# Patient Record
Sex: Female | Born: 1955
Health system: Southern US, Community
[De-identification: ages and names within clinical notes are randomized; demographics above are authoritative.]

## PROBLEM LIST (undated history)

## (undated) DIAGNOSIS — E559 Vitamin D deficiency, unspecified: Secondary | ICD-10-CM

## (undated) DIAGNOSIS — R7303 Prediabetes: Secondary | ICD-10-CM

## (undated) DIAGNOSIS — E785 Hyperlipidemia, unspecified: Secondary | ICD-10-CM

## (undated) DIAGNOSIS — F419 Anxiety disorder, unspecified: Secondary | ICD-10-CM

## (undated) DIAGNOSIS — I10 Essential (primary) hypertension: Secondary | ICD-10-CM

## (undated) DIAGNOSIS — M1710 Unilateral primary osteoarthritis, unspecified knee: Secondary | ICD-10-CM

## (undated) DIAGNOSIS — D219 Benign neoplasm of connective and other soft tissue, unspecified: Secondary | ICD-10-CM

## (undated) DIAGNOSIS — M179 Osteoarthritis of knee, unspecified: Secondary | ICD-10-CM

## (undated) HISTORY — DX: Benign neoplasm of connective and other soft tissue, unspecified: D21.9

## (undated) HISTORY — DX: Prediabetes: R73.03

## (undated) HISTORY — DX: Osteoarthritis of knee, unspecified: M17.9

## (undated) HISTORY — PX: TONSILLECTOMY: SUR1361

## (undated) HISTORY — DX: Unilateral primary osteoarthritis, unspecified knee: M17.10

## (undated) HISTORY — DX: Essential (primary) hypertension: I10

## (undated) HISTORY — PX: REDUCTION MAMMAPLASTY: SUR839

## (undated) HISTORY — PX: BREAST BIOPSY: SHX20

## (undated) HISTORY — DX: Anxiety disorder, unspecified: F41.9

## (undated) HISTORY — DX: Hyperlipidemia, unspecified: E78.5

## (undated) HISTORY — PX: CHOLECYSTECTOMY: SHX55

## (undated) HISTORY — DX: Vitamin D deficiency, unspecified: E55.9

---
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
## 1998-10-14 HISTORY — PX: BREAST REDUCTION SURGERY: SHX8

## 1998-11-17 ENCOUNTER — Other Ambulatory Visit: Admission: RE | Admit: 1998-11-17 | Discharge: 1998-11-17 | Payer: Self-pay | Admitting: Obstetrics and Gynecology

## 2000-05-21 ENCOUNTER — Other Ambulatory Visit: Admission: RE | Admit: 2000-05-21 | Discharge: 2000-05-21 | Payer: Self-pay | Admitting: Obstetrics and Gynecology

## 2001-07-17 ENCOUNTER — Other Ambulatory Visit: Admission: RE | Admit: 2001-07-17 | Discharge: 2001-07-17 | Payer: Self-pay | Admitting: *Deleted

## 2002-11-09 ENCOUNTER — Other Ambulatory Visit: Admission: RE | Admit: 2002-11-09 | Discharge: 2002-11-09 | Payer: Self-pay | Admitting: Obstetrics and Gynecology

## 2004-03-01 ENCOUNTER — Other Ambulatory Visit: Admission: RE | Admit: 2004-03-01 | Discharge: 2004-03-01 | Payer: Self-pay | Admitting: Obstetrics and Gynecology

## 2004-04-09 ENCOUNTER — Inpatient Hospital Stay (HOSPITAL_COMMUNITY): Admission: RE | Admit: 2004-04-09 | Discharge: 2004-04-11 | Payer: Self-pay | Admitting: Obstetrics and Gynecology

## 2004-04-09 ENCOUNTER — Encounter (INDEPENDENT_AMBULATORY_CARE_PROVIDER_SITE_OTHER): Payer: Self-pay | Admitting: Specialist

## 2004-04-09 HISTORY — PX: ABDOMINAL HYSTERECTOMY: SHX81

## 2005-07-31 ENCOUNTER — Encounter: Admission: RE | Admit: 2005-07-31 | Discharge: 2005-07-31 | Payer: Self-pay | Admitting: Obstetrics and Gynecology

## 2006-09-09 ENCOUNTER — Encounter: Admission: RE | Admit: 2006-09-09 | Discharge: 2006-09-09 | Payer: Self-pay | Admitting: Obstetrics and Gynecology

## 2008-08-31 ENCOUNTER — Encounter: Admission: RE | Admit: 2008-08-31 | Discharge: 2008-08-31 | Payer: Self-pay | Admitting: Obstetrics and Gynecology

## 2009-08-31 ENCOUNTER — Encounter: Admission: RE | Admit: 2009-08-31 | Discharge: 2009-08-31 | Payer: Self-pay | Admitting: Obstetrics and Gynecology

## 2010-09-05 ENCOUNTER — Encounter: Admission: RE | Admit: 2010-09-05 | Discharge: 2010-09-05 | Payer: Self-pay | Admitting: Obstetrics and Gynecology

## 2011-03-01 NOTE — Discharge Summary (Signed)
NAME:  Shirley Kent, Shirley Kent                      ACCOUNT NO.:  [id number]   MEDICAL RECORD NO.:  [id number]                   PATIENT TYPE:  INP   LOCATION:  9306                                 FACILITY:  [facility]   PHYSICIAN:  Cynthia P. Romine, M.D.             DATE OF BIRTH:  Dec 17, 1955   DATE OF ADMISSION:  04/09/2004  DATE OF DISCHARGE:  04/11/2004                                 DISCHARGE SUMMARY   DISCHARGE DIAGNOSIS:  Submucosal, intramural, and serosal leiomyomata.   PROCEDURES:  Total abdominal hysterectomy.   HISTORY:  This is a 55 year old married black female gravida 1 para 0 with  incapacitating menorrhagia unresponsive to medical management and a uterine  of 15-week size consistent with multiple fibroids.   HOSPITAL COURSE:  On May 09, 2004 the patient was taken to the operating  room and underwent a total abdominal hysterectomy.  Estimated blood loss was  200 mL.  There were no complications.  Postoperatively she had an uneventful  postoperative course and was sent home on postoperative day #2 afebrile and  in good condition.  She was given full discharge instructions regarding  pelvic rest and follow-up in the office in 6 weeks.   LABORATORY DATA:  Labs showed admission H&H 12 and 37.9, on discharge 11.8  and 35.5.  Pathology report revealed submucosal, intramural, and serosal  leiomyomata.                                               Cynthia P. Romine, M.D.    CPR/MEDQ  D:  05/02/2004  T:  05/02/2004  Job:  [id number]

## 2011-03-01 NOTE — Op Note (Signed)
NAME:  Shirley Kent, Shirley Kent                      ACCOUNT NO.:  [id number]   MEDICAL RECORD NO.:  [id number]                   PATIENT TYPE:  INP   LOCATION:  9399                                 FACILITY:  [facility]   PHYSICIAN:  Cynthia P. Romine, M.D.             DATE OF BIRTH:  01/14/1956   DATE OF PROCEDURE:  04/09/2004  DATE OF DISCHARGE:                                 OPERATIVE REPORT   PREOPERATIVE DIAGNOSES:  1. Menorrhagia.  2. Multiple intramural uterine fibroids.   POSTOPERATIVE DIAGNOSES:  1. Menorrhagia.  2. Multiple intramural uterine fibroids.  3. Pathology pending.   PROCEDURE:  Total abdominal hysterectomy.   SURGEON:  Cynthia P. Romine, M.D.   ASSISTANT:  Andres Ege, M.D.   ANESTHESIA:  General endotracheal anesthesia.   ESTIMATED BLOOD LOSS:  200 mL.   COMPLICATIONS:  None.   DESCRIPTION OF PROCEDURE:  The patient is taken to the operating room and  after the induction of adequate general endotracheal anesthesia, was prepped  and draped in the usual fashion and Foley catheter inserted.  A Pfannenstiel  incision was made and carried down to the fascia using a Bovie.  Fascia was  nicked and opened transversely.  Kochers were used to grasp the fascial  margins that were separated from the underlying rectus muscles using the  Bovie.  The rectus muscles were separated sharply in the midline.  The  underlying peritoneum was elevated between hemostats and entered  atraumatically.  The peritoneum was opened vertically.  The upper abdomen  was explored.  The liver edge was smooth.  The gallbladder contained no  stones.  There was no pelvic or periaortic adenopathy.  In the pelvis, the  uterus was enlarged and nodular, approximately 16-week size with multiple  intramural fibroids.  The left ovary was adherent to the posterior side of  the uterus.  The right ovary was free.  Both tubes appeared normal.  Long  Kellys were placed at the uterine cornu and the  uterus was elevated out of  the incision.  Round ligaments were identified on each side, stitched, cut  and the anterior leaf of the broad ligament was taken down sharply.  The  left ovary was then free from its rather dense adherence to the posterior  side of the uterus.  A window was created in the mesosalpinx and a pedicle  containing the utero-ovarian, round and tube was clamped, cut and doubly  tied on both sides.  The uterine artery on the right could be skeletonized  and was clamped, cut and doubly tied.  On the left, there was approximately  6 cm broad ligament fibroid that prevented visualization of the uterine  artery.  Therefore, this myoma was incised with the knife and shelled out at  which point, the uterine artery came into view.  It was skeletonized,  clamped, cut and doubly tied .  Upon completing of ligation of both uterine  arteries, the fundus was removed with the knife and sent for pathologic  evaluation.  The cervix was grasped with Kochers.  The hysterectomy  continued down the cardinal ligament, clamping, cutting and tying in  sequence.  The uterosacral ligaments were clamped, cut and tied on each  side.  The vagina was entered anteriorly.  The specimen was removed with  Mayo scissors.  The vaginal angles were grasped with Kochers. Angle sutures  were placed in each of the right and left angles.  The cuff was then closed  with interrupted sutures of figure-of-eight 0 chromic.  Copious warm  irrigation was then carried out and the pelvis was felt to be hemostatic.  The ureters were identified on both sides and felt to be normal.  The  ureters were tied up to the pedicle containing the round ligament to prevent  adherence to the cuff.  The pelvis was again inspected and felt to be  hemostatic.  The packs were removed.  The Balfour retractor was removed.  The peritoneum was grasped with hemostats and closed in running fashion  using 2-0 chromic.  A subfascial on cue  catheter was placed and the fascia  was closed in a running fashion going from each angle toward the midline  using 0 Vicryl.  Hemostasis was achieved of the subcutaneous tissue with the  Bovie. The subcutaneous tissue was irrigated.  An on cue catheter was placed  in the subcutaneous space.  The skin was closed with a subcuticular stitch  of 3-0 Vicryl Rapide.  The incision was supported with Benzoin and Steri-  Strips.  There were 10 mL of 1% lidocaine injected into the subfascial space  and 5 mL of 1% lidocaine were injected into the subcutaneous space and the  procedure was terminated.  Sponge, needle and instrument counts were correct  x3.  The patient was taken to the recovery room in satisfactory condition.                                               Cynthia P. Romine, M.D.    CPR/MEDQ  D:  04/09/2004  T:  04/09/2004  Job:  [id number]

## 2011-04-17 ENCOUNTER — Inpatient Hospital Stay (HOSPITAL_COMMUNITY)
Admission: EM | Admit: 2011-04-17 | Discharge: 2011-04-20 | DRG: 493 | Disposition: A | Discharge status: Home / Self Care | Payer: Federal, State, Local not specified - PPO | Attending: Internal Medicine | Admitting: Internal Medicine

## 2011-04-17 ENCOUNTER — Emergency Department (HOSPITAL_COMMUNITY): Payer: Federal, State, Local not specified - PPO

## 2011-04-17 DIAGNOSIS — E7800 Pure hypercholesterolemia, unspecified: Secondary | ICD-10-CM | POA: Diagnosis present

## 2011-04-17 DIAGNOSIS — K8060 Calculus of gallbladder and bile duct with cholecystitis, unspecified, without obstruction: Secondary | ICD-10-CM | POA: Diagnosis present

## 2011-04-17 DIAGNOSIS — Z6837 Body mass index (BMI) 37.0-37.9, adult: Secondary | ICD-10-CM

## 2011-04-17 DIAGNOSIS — K859 Acute pancreatitis without necrosis or infection, unspecified: Principal | ICD-10-CM | POA: Diagnosis present

## 2011-04-17 DIAGNOSIS — K8064 Calculus of gallbladder and bile duct with chronic cholecystitis without obstruction: Secondary | ICD-10-CM | POA: Diagnosis present

## 2011-04-17 LAB — DIFFERENTIAL
Basophils Absolute: 0 10*3/uL (ref 0.0–0.1)
Basophils Relative: 0 % (ref 0–1)
Eosinophils Absolute: 0.1 10*3/uL (ref 0.0–0.7)
Eosinophils Relative: 1 % (ref 0–5)
Lymphocytes Relative: 20 % (ref 12–46)
Lymphs Abs: 1.7 10*3/uL (ref 0.7–4.0)
Monocytes Absolute: 0.4 10*3/uL (ref 0.1–1.0)
Monocytes Relative: 5 % (ref 3–12)
Neutro Abs: 6.3 10*3/uL (ref 1.7–7.7)
Neutrophils Relative %: 74 % (ref 43–77)

## 2011-04-17 LAB — URINALYSIS, ROUTINE W REFLEX MICROSCOPIC
Bilirubin Urine: NEGATIVE
Glucose, UA: NEGATIVE mg/dL
Hgb urine dipstick: NEGATIVE
Ketones, ur: NEGATIVE mg/dL
Nitrite: NEGATIVE
Protein, ur: 30 mg/dL — AB
Specific Gravity, Urine: 1.021 (ref 1.005–1.030)
Urobilinogen, UA: 0.2 mg/dL (ref 0.0–1.0)
pH: 8.5 — ABNORMAL HIGH (ref 5.0–8.0)

## 2011-04-17 LAB — COMPREHENSIVE METABOLIC PANEL
ALT: 315 U/L — ABNORMAL HIGH (ref 0–35)
AST: 564 U/L — ABNORMAL HIGH (ref 0–37)
Albumin: 3.9 g/dL (ref 3.5–5.2)
Alkaline Phosphatase: 106 U/L (ref 39–117)
BUN: 11 mg/dL (ref 6–23)
CO2: 27 mEq/L (ref 19–32)
Calcium: 9.7 mg/dL (ref 8.4–10.5)
Chloride: 100 mEq/L (ref 96–112)
Creatinine, Ser: 0.71 mg/dL (ref 0.50–1.10)
GFR calc Af Amer: >60 mL/min (ref >60)
GFR calc non Af Amer: >60 mL/min (ref >60)
Glucose, Bld: 136 mg/dL — ABNORMAL HIGH (ref 70–99)
Potassium: 3.7 mEq/L (ref 3.5–5.1)
Sodium: 140 mEq/L (ref 135–145)
Total Bilirubin: 1.1 mg/dL (ref 0.3–1.2)
Total Protein: 7.3 g/dL (ref 6.0–8.3)

## 2011-04-17 LAB — URINE MICROSCOPIC-ADD ON

## 2011-04-17 LAB — CBC
HCT: 37.1 % (ref 36.0–46.0)
Hemoglobin: 12.6 g/dL (ref 12.0–15.0)
MCH: 29.9 pg (ref 26.0–34.0)
MCHC: 34 g/dL (ref 30.0–36.0)
MCV: 87.9 fL (ref 78.0–100.0)
Platelets: 254 10*3/uL (ref 150–400)
RBC: 4.22 MIL/uL (ref 3.87–5.11)
RDW: 13.1 % (ref 11.5–15.5)
WBC: 8.5 10*3/uL (ref 4.0–10.5)

## 2011-04-17 LAB — LIPASE, BLOOD: Lipase: >3000 U/L — ABNORMAL HIGH (ref 11–59)

## 2011-04-18 ENCOUNTER — Other Ambulatory Visit (HOSPITAL_COMMUNITY): Payer: BC Managed Care – PPO

## 2011-04-18 DIAGNOSIS — K859 Acute pancreatitis without necrosis or infection, unspecified: Secondary | ICD-10-CM

## 2011-04-18 DIAGNOSIS — R1011 Right upper quadrant pain: Secondary | ICD-10-CM

## 2011-04-18 DIAGNOSIS — K8020 Calculus of gallbladder without cholecystitis without obstruction: Secondary | ICD-10-CM

## 2011-04-18 LAB — CBC
HCT: 32.8 % — ABNORMAL LOW (ref 36.0–46.0)
Hemoglobin: 10.7 g/dL — ABNORMAL LOW (ref 12.0–15.0)
MCH: 28.7 pg (ref 26.0–34.0)
MCHC: 32.6 g/dL (ref 30.0–36.0)
MCV: 87.9 fL (ref 78.0–100.0)
Platelets: 226 10*3/uL (ref 150–400)
RBC: 3.73 MIL/uL — ABNORMAL LOW (ref 3.87–5.11)
RDW: 13.3 % (ref 11.5–15.5)
WBC: 6.2 10*3/uL (ref 4.0–10.5)

## 2011-04-18 LAB — COMPREHENSIVE METABOLIC PANEL
ALT: 200 U/L — ABNORMAL HIGH (ref 0–35)
AST: 147 U/L — ABNORMAL HIGH (ref 0–37)
Albumin: 3.3 g/dL — ABNORMAL LOW (ref 3.5–5.2)
Alkaline Phosphatase: 90 U/L (ref 39–117)
BUN: 10 mg/dL (ref 6–23)
CO2: 27 mEq/L (ref 19–32)
Calcium: 8.8 mg/dL (ref 8.4–10.5)
Chloride: 107 mEq/L (ref 96–112)
Creatinine, Ser: 0.67 mg/dL (ref 0.50–1.10)
GFR calc Af Amer: >60 mL/min (ref >60)
GFR calc non Af Amer: >60 mL/min (ref >60)
Glucose, Bld: 99 mg/dL (ref 70–99)
Potassium: 3.3 mEq/L — ABNORMAL LOW (ref 3.5–5.1)
Sodium: 141 mEq/L (ref 135–145)
Total Bilirubin: 0.5 mg/dL (ref 0.3–1.2)
Total Protein: 6.3 g/dL (ref 6.0–8.3)

## 2011-04-18 LAB — LIPID PANEL
Cholesterol: 183 mg/dL (ref 0–200)
HDL: 49 mg/dL (ref >39)
LDL Cholesterol: 89 mg/dL (ref 0–99)
Total CHOL/HDL Ratio: 3.7 RATIO
Triglycerides: 227 mg/dL — ABNORMAL HIGH (ref <150)
VLDL: 45 mg/dL — ABNORMAL HIGH (ref 0–40)

## 2011-04-18 LAB — DIFFERENTIAL
Basophils Absolute: 0 10*3/uL (ref 0.0–0.1)
Basophils Relative: 0 % (ref 0–1)
Eosinophils Absolute: 0.2 10*3/uL (ref 0.0–0.7)
Eosinophils Relative: 4 % (ref 0–5)
Lymphocytes Relative: 47 % — ABNORMAL HIGH (ref 12–46)
Lymphs Abs: 2.9 10*3/uL (ref 0.7–4.0)
Monocytes Absolute: 0.4 10*3/uL (ref 0.1–1.0)
Monocytes Relative: 7 % (ref 3–12)
Neutro Abs: 2.7 10*3/uL (ref 1.7–7.7)
Neutrophils Relative %: 42 % — ABNORMAL LOW (ref 43–77)

## 2011-04-18 LAB — LIPASE, BLOOD: Lipase: 781 U/L — ABNORMAL HIGH (ref 11–59)

## 2011-04-18 LAB — AMYLASE: Amylase: 575 U/L — ABNORMAL HIGH (ref 0–105)

## 2011-04-18 LAB — LACTATE DEHYDROGENASE
LDH: 387 U/L — ABNORMAL HIGH (ref 94–250)
LDH: 205 U/L (ref 94–250)

## 2011-04-18 MED ORDER — IOHEXOL 300 MG/ML  SOLN
100.0000 mL | Freq: Once | INTRAMUSCULAR | Status: AC | PRN
  Administered 2011-04-18: 100 mL via INTRAVENOUS

## 2011-04-19 ENCOUNTER — Other Ambulatory Visit (INDEPENDENT_AMBULATORY_CARE_PROVIDER_SITE_OTHER): Payer: Self-pay | Admitting: Surgery

## 2011-04-19 ENCOUNTER — Inpatient Hospital Stay (HOSPITAL_COMMUNITY): Payer: Federal, State, Local not specified - PPO

## 2011-04-19 ENCOUNTER — Other Ambulatory Visit (HOSPITAL_COMMUNITY): Payer: BC Managed Care – PPO

## 2011-04-19 LAB — COMPREHENSIVE METABOLIC PANEL
ALT: 152 U/L — ABNORMAL HIGH (ref 0–35)
AST: 73 U/L — ABNORMAL HIGH (ref 0–37)
Albumin: 3.1 g/dL — ABNORMAL LOW (ref 3.5–5.2)
Alkaline Phosphatase: 84 U/L (ref 39–117)
BUN: 7 mg/dL (ref 6–23)
CO2: 28 mEq/L (ref 19–32)
Calcium: 8.7 mg/dL (ref 8.4–10.5)
Chloride: 107 mEq/L (ref 96–112)
Creatinine, Ser: 0.67 mg/dL (ref 0.50–1.10)
GFR calc Af Amer: >60 mL/min (ref >60)
GFR calc non Af Amer: >60 mL/min (ref >60)
Glucose, Bld: 99 mg/dL (ref 70–99)
Potassium: 3.5 mEq/L (ref 3.5–5.1)
Sodium: 142 mEq/L (ref 135–145)
Total Bilirubin: 0.4 mg/dL (ref 0.3–1.2)
Total Protein: 6.3 g/dL (ref 6.0–8.3)

## 2011-04-19 LAB — CBC
HCT: 33.2 % — ABNORMAL LOW (ref 36.0–46.0)
Hemoglobin: 10.6 g/dL — ABNORMAL LOW (ref 12.0–15.0)
MCH: 28.6 pg (ref 26.0–34.0)
MCHC: 31.9 g/dL (ref 30.0–36.0)
MCV: 89.7 fL (ref 78.0–100.0)
Platelets: 222 10*3/uL (ref 150–400)
RBC: 3.7 MIL/uL — ABNORMAL LOW (ref 3.87–5.11)
RDW: 13.4 % (ref 11.5–15.5)
WBC: 6.7 10*3/uL (ref 4.0–10.5)

## 2011-04-19 LAB — ANA: Anti Nuclear Antibody(ANA): NEGATIVE

## 2011-04-20 NOTE — Op Note (Signed)
Shirley Kent, PALS NO.:  [id number]  MEDICAL RECORD NO.:  [id number]  LOCATION:  5149                         FACILITY:  [facility]  PHYSICIAN:  Abigail Miyamoto, M.D. DATE OF BIRTH:  1956/03/13  DATE OF PROCEDURE:  04/19/2011 DATE OF DISCHARGE:                              OPERATIVE REPORT   PREOPERATIVE DIAGNOSIS:  Gallstone pancreatitis.  POSTOPERATIVE DIAGNOSIS:  Gallstone pancreatitis.  PROCEDURE:  Laparoscopic cholecystectomy with intraoperative cholangiogram.  SURGEON:  Abigail Miyamoto, MD  ANESTHESIA:  General endotracheal anesthesia.  ESTIMATED BLOOD LOSS:  Minimal.  INDICATIONS:  This is a 55 year old female who presents with epigastric right upper quadrant abdominal pain.  She was found to have pancreatitis.  She had a CAT scan of the abdomen and pelvis confirming this as well as gallstones.  Decision was made to proceed to the operating room for a laparoscopic cholecystectomy as the patient improved quickly.  FINDINGS:  The patient was found to have a chronically scarred appearing gallbladder consistent with chronic cholecystitis.  Cholangiogram was normal without evidence of obstructing stone.  PROCEDURE IN DETAIL:  The patient was brought to the operating room and identified as Shirley Kent.  She was placed supine on the operating table and general anesthesia was induced.  Her abdomen was then prepped and draped in the usual sterile fashion.  Using a #15 blade, a vertical incision was made below the umbilicus.  This was carried down to fascia which was then opened with a scalpel.  Hemostat was used to pass to the peritoneal cavity under direct vision.  Next, a 0 Vicryl suture was placed around the fascial opening.  The Hasson port was placed through the opening and insufflation of the abdomen was begun.  A 5-mm port was then placed in the patient's epigastrium and two more placed in the patient's right upper quadrant under direct  vision.  The gallbladder was found to be thick-walled with adhesions to this.  I was able to grasp the gallbladder and easily retract it above the liver bed.  The adhesions have been taken down bluntly.  The cystic duct and cystic artery were then dissected out and a critical window was achieved around both.  I clipped the artery three times proximally, once distally, I then clipped the duct once distally and then opened it up with laparoscopic scissors.  A small incision was then made in the right upper quadrant and cholangiocatheter was passed through this and into the opening in the cystic duct.  The cholangiogram was then performed with contrast.  Contrast seemed to flow into the biliary system and duodenum without evidence of obstruction.  At this point, the cholangiocatheter was removed.  I clipped the cystic duct three times proximally and then completely transected along with the artery.  The gallbladder was then slowly dissected free from liver bed with electrocautery.  Once this was freed from the liver bed, it was removed through the incision at the umbilicus.  I again evaluated the liver bed, again hemostasis appeared to be achieved.  I then irrigated the abdomen with saline.  All ports were removed under direct vision and the abdomen was deflated.  All incisions were then anesthetized with Marcaine.  The  0 Vicryl at the umbilicus was tied in place closing the fascial defect.  All incisions were then closed with 4-0 Monocryl subcuticular sutures.  Steri-Strips and Band-Aids were then applied.  The patient tolerated the procedure well.  All counts were correct at the end of the procedure.  The patient was then extubated in the operating room and taken in stable condition to recovery room.     Abigail Miyamoto, M.D.     DB/MEDQ  D:  04/19/2011  T:  04/19/2011  Job:  [id number]  Electronically Signed by Abigail Miyamoto M.D. on 04/20/2011 10:45:25 AM

## 2011-04-21 HISTORY — PX: CHOLECYSTECTOMY, LAPAROSCOPIC: SHX56

## 2011-04-25 NOTE — Consult Note (Signed)
Shirley, Kent NO.:  [id number]  MEDICAL RECORD NO.:  [id number]  LOCATION:  5149                         FACILITY:  [facility]  PHYSICIAN:  Abigail Miyamoto, M.D. DATE OF BIRTH:  23-Apr-1956  DATE OF CONSULTATION:  04/18/2011 DATE OF DISCHARGE:                                CONSULTATION   REFERRING PHYSICIAN:  Jeoffrey Massed, MD  PRIMARY CARE PHYSICIAN:  Merlene Laughter. Renae Gloss, MD  CHIEF COMPLAINT:  Abdominal pain.  BRIEF HISTORY:  The patient is a 55 year old African American female, who is in good health.  She had a late breakfast yesterday and developed acute onset of abdominal pain around 2:00 p.m.  The pain never got any better.  She ultimately went to the emergency room at 9:00 p.m.  She said she had a similar problem about 2 months ago and at that time the pain lasted about an hour and it was as bad.  She denies any nausea, vomiting, diarrhea, constipation, or blood with her pain.  Pain was better in the ER with treatment.  Currently, she is feeling better on clear liquids.  No current abdominal pain.  Workup in the ER included a CBC which shows a white count of 7.5, a hemoglobin 12.6, hematocrit 37, platelets 254,000.  UA was normal.  Sodium is 137, potassium is 3.7, chloride is 100, CO2 is 27, BUN is 11, creatinine is 0.71, glucose 136. Total bilirubin is 1.1.  LFTs showed an alk phos of 106, SGOT of 564, SGPT of 315.  Today, April 18, 2011, total bilirubin 0.5, alk phos 90, SGOT 147, SGPT 200.  Lipase on admission was greater than 3000, today it was less 781.  LDH is 205, amylase is 575.  Total cholesterol of 183, triglycerides 227, HDL 49, LDL is 89.  CT of the abdomen shows stranding around the pancreas.  Liver, spleen, and kidneys were normal.  There is layering gallstone to the gallbladder.  Common bile duct appeared normal.  There was no intrahepatic or ductal dilatation.  She has a prior hysterectomy with no adnexal masses.  No free-fluid air  or adenopathy.  PAST MEDICAL HISTORY: 1. Dyslipidemia. 2. Height is 64 inches, weight is 221 pounds.  She has been on diet     and is down from 237 in January 2012. 3. Hysterectomy in 2005. 4. History of breast reduction. 5. History of tonsil adenoids in 1980.  FAMILY HISTORY:  Mother is living at 50, had some cholesterol issues and coronary artery disease.  Father is deceased, unknown.  She has 3 brothers, 2 are deceased, one with alcohol problems, one traumatic.  One brother is living in good health.  One sister deceased with alcohol use and one is living in good health.  SOCIAL HISTORY:  The patient is married, works in Airline pilot.  Her husband is here with her.  Alcohol none.  Drugs none.  Tobacco none.  REVIEW OF SYSTEMS:  Fever, none.  Weight loss as noted in January with diet and exercise.  There is psych issues.  No skin changes.  CV: Negative.  PULMONARY:  Positive for snore.  No apnea.  No orthopnea, PND, or dyspnea on exertion.  She had recent cold.  GI:  Negative for GERD, nausea, vomiting, diarrhea, constipation, or blood.  GU:  No trouble voiding.  CARDIAC:  No chest pain or palpitations.  LOWER EXTREMITIES:  No edema.  No claudication.  MUSCULOSKELETAL:  She has some arthritis in her knees and her right foot.  ENDOCRINE:  No diabetes or thyroid issues.  CURRENT MEDICATIONS:  She was on Crestor, apparently is on WelChol now. No other meds.  ALLERGIES:  None known.  PHYSICAL EXAM:  GENERAL:  This is a well-nourished, well-developed white female in no acute distress. VITAL SIGNS:  Temperature is 98.4, heart rate is 71, blood pressure is 125/66, respiratory rate 18 to 21, sat 98% on room air. HEENT:  Head, normocephalic.  Eyes, ears, nose, and throat are within normal limits. NECK:  Trachea is in midline.  No bruits.  No JVD. CHEST:  Clear to auscultation and percussion. CARDIAC:  Normal S1 and S2.  No murmurs, rubs, or gallops.  Pulses are +2 and equal in both upper  and lower extremities. ABDOMEN:  Soft and nontender.  Positive bowel sounds.  No palpable hepatosplenomegaly.  There is no abdominal masses, hernias, or abscesses. GU/RECTAL:  Deferred. MUSCULOSKELETAL:  No joint changes noted. SKIN:  No changes. NEUROLOGIC:  No focal changes.  Cranial nerves II through XII grossly intact. PSYCH:  Normal affect.  IMPRESSION: 1. Gallstone pancreatitis. 2. Dyslipidemia. 3. BMI of 37.9.  PLAN:  We will keep her n.p.o. after midnight and recheck her labs in the morning.  If they are normal, we will aim for cholecystectomy tomorrow.  I will hold her Lovenox the a.m. until we decide on doing her gallbladder.  Further workup and evaluation as needed.  She has been seen by Dr. Magnus Ivan and the risks and benefits were discussed with him also.     Eber Hong, P.A.   ______________________________ Abigail Miyamoto, M.D.    WDJ/MEDQ  D:  04/18/2011  T:  04/18/2011  Job:  [id number]  cc:   Merlene Laughter. Renae Gloss, M.D.  Electronically Signed by Sherrie George P.A. on 04/22/2011 01:12:20 PM Electronically Signed by Abigail Miyamoto M.D. on 04/25/2011 07:24:20 PM

## 2011-04-25 NOTE — H&P (Signed)
Shirley Kent, BENCOSME NO.:  [id number]  MEDICAL RECORD NO.:  [id number]  LOCATION:  [facility]                         FACILITY:  [facility]  PHYSICIAN:  Jonny Ruiz, MD    DATE OF BIRTH:  03-27-1956  DATE OF ADMISSION:  04/17/2011 DATE OF DISCHARGE:                             HISTORY & PHYSICAL   CHIEF COMPLAINT:  Abdominal pain.  HISTORY OF PRESENT ILLNESS:  The patient is a healthy 55 year old female with past medical history significant for hypercholesteremia, who presents to the emergency department because sudden onset of epigastric abdominal pain radiating to the right upper quadrant, rated 10/10 in severity, lasting from 3 p.m. until she came to the emergency department around 8 p.m., and relieved by morphine sulfate given in the ED.  The patient vomited twice yellow liquid contents with no hematemesis.  The patient was feeling well prior to this episode of abdominal pain and vomiting.  The patient denies prior history of abdominal pain, nausea, or vomiting.  Initial evaluation in the emergency department revealed a lipase greater than 3000, and a CT scan of the abdomen revealed changes of mild pancreatitis with gallstones.  The patient is being admitted for further management.  PAST MEDICAL HISTORY:  Significant for hypercholesteremia, for which the patient takes Crestor and recently began taking WelCho1.  ALLERGIES:  No known drug allergies.  FAMILY HISTORY:  Hypercholesteremia.  SURGICAL HISTORY:  Hysterectomy for large fibroids and uncontrolled bleeding.  SOCIAL HISTORY:  The patient works as a Forensic psychologist.  She is married.  Husband at the bedside.  Denies tobacco, alcohol, or illicits.  REVIEW OF SYSTEMS:  CONSTITUTIONAL:  Negative for fever, chills, night sweats, fatigue, malaise, or weight loss.  CARDIOVASCULAR:  Negative for chest pain, shortness of breath, palpitations, orthopnea, nocturia, PND or edema.   RESPIRATORY:  Negative for cough, shortness of breath, wheezing, hemoptysis.  GASTROINTESTINAL:  Denies hematochezia, melena, diarrhea, or constipation.  GENITOURINARY:  Denies dysuria, frequency or hematuria.  MUSCULOSKELETAL:  No arthralgias or myalgias, joint pain or morning stiffness.  DERMATOLOGIC:  Denies any skin rash or growth.  PHYSICAL EXAMINATION:  VITAL SIGNS:  Blood pressure 169/84, pulse 81, respirations 18, temperature 98.4, sats 98% on room air. GENERAL APPEARANCE:  The patient is an overweight African American female, who appears in no distress.  Very pleasant and cooperative. SKIN MUCOSA:  No jaundice.  Good turgor. HEENT:  Unremarkable. NECK:  Supple.  No lymphadenopathy.  No JVD. HEART:  Regular S1 and S2 without gallops, murmurs, or rubs. LUNGS:  Clear to auscultation. ABDOMEN:  Obese with normal bowel sounds, soft.  There is mild tenderness on palpation of the right lower quadrant which increased with deep inspiration.  I am unable to palpate the gallbladder.  No organomegaly or masses palpable. EXTREMITIES:  Without clubbing, cyanosis or edema. NEUROLOGIC:  Nonfocal.  LABORATORY ASSESSMENT:  Sodium 140, potassium 3.7, chloride 100, carbon dioxide 27, BUN 11, creatinine 0.71, calcium 9.7, total protein 7.3, albumin 3.9, AST 564, ALT 315, alk phos 106, bilirubin 1.1, lipase greater than 3000.  WBC 8.5, hemoglobin 12.6, hematocrit 37.1, MCV 87.9, platelet count 254.  UA negative.  CT scan of the abdomen, as in  HPI.  MEDICAL DECISION MAKING:  A 55 year old female with no significant past medical history presents into the ED with sudden onset of abdominal pain, nausea and vomiting, and found with acute gallstone pancreatitis. The patient is medically stable and at low-risk at this point.  She will be admitted to a regular medical floor.  Will be n.p.o. and will be maintained on IV fluids.  We will repeat her labs including CMET, amylase, lipase, along with an ANA  and a lipid profile and LDH at around noon today.  The patient is noted to be hypertensive, we will reassess once she is up on the floor.  The patient eventually will need to gallbladder removed, but at this point, she will be managed conservatively.          ______________________________ Jonny Ruiz, MD     GL/MEDQ  D:  04/18/2011  T:  04/18/2011  Job:  [id number]  Electronically Signed by Jonny Ruiz MD on 04/25/2011 04:53:39 PM

## 2011-04-28 NOTE — Discharge Summary (Signed)
Shirley Kent, KUSHNER NO.:  [id number]  MEDICAL RECORD NO.:  [id number]  LOCATION:  5149                         FACILITY:  [facility]  PHYSICIAN:  Jeoffrey Massed, MD    DATE OF BIRTH:  Sep 18, 1956  DATE OF ADMISSION:  04/17/2011 DATE OF DISCHARGE:                        DISCHARGE SUMMARY - REFERRING   PRIMARY CARE PRACTITIONER:  Merlene Laughter. Renae Gloss, MD  PRIMARY DISCHARGE DIAGNOSIS:  Gallstone pancreatitis status post cholecystectomy.  SECONDARY DISCHARGE DIAGNOSIS/PAST MEDICAL HISTORY:  Dyslipidemia.  DISCHARGE MEDICATIONS: 1. Vicodin 5/325 one to two tablets every 4 hours p.r.n. 2. Aleve over the counter 220 mg two tablets p.o. q.12 h. p.r.n. 3. Fish oil 1 tablet daily. 4. Multivitamins 1 tablet daily. 5. Red krill oil over the counter 1 tablet daily. 6. TripleFlex 1 tablet p.o. daily. 7. WelChol 1.875 g per packet, 1 packet p.o. after dinner.  CONSULTATIONS:  Dr. Magnus Ivan from [facility]. HISTORY OF PRESENT ILLNESS:  The patient is a 55 year old female who presented to the ED on April 17, 2011, with abdominal pain.  She was found to have gallstone pancreatitis and admitted for the supportive care. For further details, please see the history and physical that was dictated by Dr. Jordan Hawks on admission.  PERTINENT RADIOLOGICAL STUDIES:  CT of the abdomen and pelvis with contrast showed slight stranding around the pancreas and transverse duodenum, likely reflecting mild pancreatitis.  There was no common bile duct dilatation or intrahepatic ductal dilatation.  PROCEDURES PERFORMED:  The patient underwent a laparoscopic cholecystectomy with intraoperative angiogram which was normal.  PERTINENT LABORATORY DATA: 1. Lipase on admission was more than 3000. 2. Lipase on discharge is 97.  BRIEF HOSPITAL COURSE: 1. Gallstone pancreatitis.  The patient came with abdominal pain with     LFT elevation with CT of the abdomen evidence of gallstones.   She     was given supportive care, briefly kept n.p.o. and improved     rapidly.  Subsequently, Surgery was consulted for a cholecystectomy     which was then done on April 19, 2011.  Please note, an     intraoperative cholangiogram was negative for choledocholithiasis.     She has done well postoperatively and has tolerated a regular diet     and has moved her bowels and has been cleared for surgery for     discharge today.  She is to follow up with surgery on May 07, 2011, at 3:10 p.m. 2. Dyslipidemia.  She is to continue WelChol and is to stop Crestor     until her LFTs are normalized.  Before she resumes Crestor, she is     to follow up with her primary care practitioner as well.  All of     this was explained to the patient in detail by me.  DISPOSITION:  The patient is stable to be discharged home.  FOLLOWUP INSTRUCTIONS: 1. The patient is to follow up with [facility] on Mar 07, 2011, at 3:10 p.m. 2. The patient is to follow up with Dr. Andi Devon within 1     week.  She is to call and make an appointment.  Currently, her  LFTs     are still mildly elevated and we are holding her Crestor and plan     to resume it once her LFTs are normalized.  Total time spent for discharge 35 minutes.     Jeoffrey Massed, MD     SG/MEDQ  D:  04/20/2011  T:  04/20/2011  Job:  [id number]  cc:   Merlene Laughter. Renae Gloss, M.D. Dr. Magnus Ivan from [facility]  Electronically Signed by Jeoffrey Massed  on 04/28/2011 11:15:54 AM

## 2011-05-07 ENCOUNTER — Encounter (INDEPENDENT_AMBULATORY_CARE_PROVIDER_SITE_OTHER): Payer: Self-pay

## 2011-05-07 ENCOUNTER — Ambulatory Visit (INDEPENDENT_AMBULATORY_CARE_PROVIDER_SITE_OTHER): Payer: Federal, State, Local not specified - PPO | Admitting: Radiology

## 2011-05-07 DIAGNOSIS — K8020 Calculus of gallbladder without cholecystitis without obstruction: Secondary | ICD-10-CM

## 2011-05-07 DIAGNOSIS — K851 Biliary acute pancreatitis without necrosis or infection: Secondary | ICD-10-CM

## 2011-05-07 DIAGNOSIS — K859 Acute pancreatitis without necrosis or infection, unspecified: Secondary | ICD-10-CM

## 2011-05-07 NOTE — Progress Notes (Addendum)
Shirley Kent is a 55 y.o. female who had a laparoscopic cholecystectomy with intraoperative cholangiogram on 04/19/11.  The pathology report confirmed cholelithiasis.  The patient reports that they are feeling well with normal bowel movements and good appetite.  The pre-operative symptoms of abdominal pain, nausea, and vomiting have resolved.  She is ready to return to work.  Physical examination - Incisions appear well-healed with no sign of infection or bleeding.   Abdomen - soft, non-tender  Impression:  s/p laparoscopic cholecystectomy for biliary pancreatitis  Plan:  Resume regular diet and full activity.  Follow-up on a PRN basis. RTW as of 05/09/11. Originally paperwork filed for return to work as of 05/15/11, but pt states she wishes to return os 05/09/11. KB

## 2011-09-17 ENCOUNTER — Other Ambulatory Visit: Payer: Self-pay | Admitting: Internal Medicine

## 2011-09-17 DIAGNOSIS — Z1231 Encounter for screening mammogram for malignant neoplasm of breast: Secondary | ICD-10-CM

## 2011-09-25 ENCOUNTER — Ambulatory Visit
Admission: RE | Admit: 2011-09-25 | Discharge: 2011-09-25 | Disposition: A | Discharge status: Home / Self Care | Payer: Federal, State, Local not specified - PPO | Source: Ambulatory Visit | Attending: Internal Medicine | Admitting: Internal Medicine

## 2011-09-25 DIAGNOSIS — Z1231 Encounter for screening mammogram for malignant neoplasm of breast: Secondary | ICD-10-CM

## 2012-03-27 LAB — HM COLONOSCOPY: HM COLON: NORMAL

## 2013-08-26 ENCOUNTER — Encounter: Payer: Self-pay | Admitting: Nurse Practitioner

## 2013-08-26 ENCOUNTER — Ambulatory Visit (INDEPENDENT_AMBULATORY_CARE_PROVIDER_SITE_OTHER): Payer: Federal, State, Local not specified - PPO | Admitting: Nurse Practitioner

## 2013-08-26 VITALS — BP 130/90 | HR 76 | Resp 20 | Ht 64.5 in | Wt 230.0 lb

## 2013-08-26 DIAGNOSIS — Z01419 Encounter for gynecological examination (general) (routine) without abnormal findings: Secondary | ICD-10-CM

## 2013-08-26 MED ORDER — VITAMIN D (ERGOCALCIFEROL) 1.25 MG (50000 UNIT) PO CAPS: 50000.0000 [IU] | ORAL_CAPSULE | ORAL | Status: DC

## 2013-08-26 NOTE — Progress Notes (Signed)
Patient ID: Shirley Kent, female   DOB: 29-Oct-1955, 57 y.o.   MRN: [id number] 57 y.o. G1P0010 Married African American Fe here for annual exam.  No new health problems except working on BP.  Husband is now retied and he is very helpful at home while she is working.  Some vaginal dryness but does OK with lubrication.  Only ocasioanal use of Estrace vaginal cream.  No LMP recorded. Patient has had a hysterectomy.          Sexually active: yes  The current method of family planning is status post hysterectomy.    Exercising: no  Exercise is limited by orthopedic condition(s): arthritis in knees and right foot. Smoker:  no  Health Maintenance: Pap: 03/01/04, WNL MMG: 09/30/11, Bi-Rads 2: negative Colonoscopy: 03/27/12, normal, repeat 5 years BMD: never TDaP: 6/11 Labs: HB: PCP  Urine: PCP    reports that she has never smoked. She has never used smokeless tobacco. She reports that she does not drink alcohol or use illicit drugs.  Past Medical History  Diagnosis Date  . Fibroid   . OA (osteoarthritis) of knee     bilateral  . Hyperlipidemia   . Vitamin D deficiency disease   . Hypertension     not on meds, working on diet and exercise    Past Surgical History  Procedure Laterality Date  . Breast reduction surgery Bilateral 2000  . Breast surgery Left 1972    biopsy benign  . Cholecystectomy, laparoscopic  04/21/11  . Abdominal hysterectomy  04/09/04    fibroids, ovaries remain  . Tonsillectomy  age 29    Current Outpatient Prescriptions  Medication Sig Dispense Refill  . Omega-3 Fatty Acids (FISH OIL) 1000 MG CAPS Take 1 capsule by mouth daily.      . simvastatin (ZOCOR) 40 MG tablet Take 1 tablet by mouth every other day.      . Vitamin D, Ergocalciferol, (DRISDOL) 50000 UNITS CAPS capsule Take 1 capsule (50,000 Units total) by mouth once a week.  30 capsule  3   No current facility-administered medications for this visit.    Family History  Problem Relation Age of Onset   . Heart failure Mother   . Kidney disease Mother     ROS:  Pertinent items are noted in HPI.  Otherwise, a comprehensive ROS was negative.  Exam:   BP 130/90  Pulse 76  Resp 20  Ht 5' 4.5" (1.638 m)  Wt 230 lb (104.327 kg)  BMI 38.88 kg/m2 Height: 5' 4.5" (163.8 cm)  Ht Readings from Last 3 Encounters:  08/26/13 5' 4.5" (1.638 m)    General appearance: alert, cooperative and appears stated age Head: Normocephalic, without obvious abnormality, atraumatic Neck: no adenopathy, supple, symmetrical, trachea midline and thyroid normal to inspection and palpation Lungs: clear to auscultation bilaterally Breasts: normal appearance, no masses or tenderness, post reduction scars. Heart: regular rate and rhythm Abdomen: soft, non-tender; no masses,  no organomegaly Extremities: extremities normal, atraumatic, no cyanosis or edema Skin: Skin color, texture, turgor normal. No rashes or lesions Lymph nodes: Cervical, supraclavicular, and axillary nodes normal. No abnormal inguinal nodes palpated Neurologic: Grossly normal   Pelvic: External genitalia:  no lesions              Urethra:  normal appearing urethra with no masses, tenderness or lesions              Bartholin's and Skene's: normal  Vagina: normal appearing vagina with normal color and discharge, no lesions              Cervix: absent              Pap taken: no Bimanual Exam:  Uterus:  uterus absent              Adnexa: no mass, fullness, tenderness               Rectovaginal: Confirms               Anus:  normal sphincter tone, no lesions  A:  Well Woman with normal exam  S/P TAH secondary to UTE fibroids 04/09/04, ovaries remain, no ERT  History of Menorrhagia  History of elevated BP  History of OA bilateral knees and feet  Vit D defieciency - labs done 06/2013 at PCP  P:   Pap smear as per guidelines   Refill Vit D for a year - will get copy of labs from PCP  Mammogram is due now and will  schedule  Does not need a refill on Estrace Vagina cream at this time.  Given potential side effects and warnings of estrogen.  Counseled on breast self exam, adequate intake of calcium and vitamin D, diet and exercise return annually or prn  An After Visit Summary was printed and given to the patient.

## 2013-08-26 NOTE — Patient Instructions (Signed)

## 2013-08-27 ENCOUNTER — Other Ambulatory Visit: Payer: Self-pay

## 2013-08-27 DIAGNOSIS — Z1231 Encounter for screening mammogram for malignant neoplasm of breast: Secondary | ICD-10-CM

## 2013-08-29 NOTE — Progress Notes (Signed)
Encounter reviewed by Dr. Brook Silva.  

## 2013-09-13 ENCOUNTER — Other Ambulatory Visit: Payer: Self-pay | Admitting: Nurse Practitioner

## 2013-09-13 MED ORDER — HYDROCORTISONE ACETATE 25 MG RE SUPP: 25.0000 mg | Freq: Two times a day (BID) | RECTAL | Status: DC

## 2013-09-13 NOTE — Telephone Encounter (Signed)
Pt calling for refill on the Hydrocortisone AC to [facility] at [phone].

## 2013-09-13 NOTE — Telephone Encounter (Signed)
LM on patient's VM that rx has been sent. 

## 2013-09-13 NOTE — Telephone Encounter (Signed)
AEX was 08/26/13 no rx was sent Last Given: 08/22/11 #24/3 refills  Okay to refill?  Please advise.

## 2013-09-29 ENCOUNTER — Ambulatory Visit: Payer: Federal, State, Local not specified - PPO

## 2013-10-11 ENCOUNTER — Ambulatory Visit
Admission: RE | Admit: 2013-10-11 | Discharge: 2013-10-11 | Disposition: A | Discharge status: Home / Self Care | Payer: Federal, State, Local not specified - PPO | Source: Ambulatory Visit

## 2013-10-11 DIAGNOSIS — Z1231 Encounter for screening mammogram for malignant neoplasm of breast: Secondary | ICD-10-CM

## 2013-11-17 ENCOUNTER — Other Ambulatory Visit: Payer: Self-pay | Admitting: Nurse Practitioner

## 2013-11-17 NOTE — Telephone Encounter (Signed)
eScribe request from [facility] for refill on VITAMIN D Last filled - 2014 Last AEX - 08/26/13 Next AEX - 09/01/14 Last Vitamin D checked at PCP in 06/2013.  Per last AEX note OK to refill x 1 year.  RX sent.

## 2014-08-15 ENCOUNTER — Encounter: Payer: Self-pay | Admitting: Nurse Practitioner

## 2014-09-01 ENCOUNTER — Encounter: Payer: Self-pay | Admitting: Nurse Practitioner

## 2014-09-01 ENCOUNTER — Ambulatory Visit (INDEPENDENT_AMBULATORY_CARE_PROVIDER_SITE_OTHER): Payer: Federal, State, Local not specified - PPO | Admitting: Nurse Practitioner

## 2014-09-01 VITALS — BP 144/86 | HR 76 | Ht 64.25 in | Wt 228.0 lb

## 2014-09-01 DIAGNOSIS — E785 Hyperlipidemia, unspecified: Secondary | ICD-10-CM

## 2014-09-01 DIAGNOSIS — E782 Mixed hyperlipidemia: Secondary | ICD-10-CM | POA: Insufficient documentation

## 2014-09-01 DIAGNOSIS — E559 Vitamin D deficiency, unspecified: Secondary | ICD-10-CM | POA: Insufficient documentation

## 2014-09-01 DIAGNOSIS — I10 Essential (primary) hypertension: Secondary | ICD-10-CM | POA: Insufficient documentation

## 2014-09-01 DIAGNOSIS — Z01419 Encounter for gynecological examination (general) (routine) without abnormal findings: Secondary | ICD-10-CM

## 2014-09-01 MED ORDER — VITAMIN D (ERGOCALCIFEROL) 1.25 MG (50000 UNIT) PO CAPS: 50000.0000 [IU] | ORAL_CAPSULE | ORAL | Status: DC

## 2014-09-01 NOTE — Progress Notes (Signed)
Patient ID: Shirley Kent, female   DOB: [birth year], 58 y.o.   MRN: [id number] 58 y.o. G1P0010 Married African American Fe here for annual exam.  No new health concerns. She will occasionally use Estrace vaginal cream -does not need RX now. She uses OTC lubrication prn.   Patient's last menstrual period was 03/26/2004 (approximate).          Sexually active: Yes.    The current method of family planning is status post hysterectomy.    Exercising: No.  The patient does not participate in regular exercise at present. Smoker:  no  Health Maintenance: Pap: 03/01/04, WNL MMG: 10/11/13, Bi-Rads 1:  Negative Colonoscopy: 03/27/12, normal, repeat 5 years BMD: never TDaP: 6/11 Labs:  HB: PCP   Urine:  PCP   reports that she has never smoked. She has never used smokeless tobacco. She reports that she does not drink alcohol or use illicit drugs.  Past Medical History  Diagnosis Date  . Fibroid   . OA (osteoarthritis) of knee     bilateral  . Hyperlipidemia   . Vitamin D deficiency disease   . Hypertension     not on meds, working on diet and exercise    Past Surgical History  Procedure Laterality Date  . Breast reduction surgery Bilateral 2000  . Breast surgery Left 1972    biopsy benign  . Cholecystectomy, laparoscopic  04/21/11  . Abdominal hysterectomy  04/09/04    fibroids, ovaries remain  . Tonsillectomy  age 80    Current Outpatient Prescriptions  Medication Sig Dispense Refill  . hydrocortisone (ANUSOL-HC) 25 MG suppository Place 1 suppository (25 mg total) rectally 2 (two) times daily. 24 suppository 3  . Omega-3 Fatty Acids (FISH OIL) 1000 MG CAPS Take 1 capsule by mouth daily.    . simvastatin (ZOCOR) 40 MG tablet Take 1 tablet by mouth every other day.    . triamterene-hydrochlorothiazide (MAXZIDE-25) 37.5-25 MG per tablet Take 0.5 tablets by mouth daily.  2  . Vitamin D, Ergocalciferol, (DRISDOL) 50000 UNITS CAPS capsule Take 1 capsule (50,000 Units total) by mouth once  a week. 26 capsule 2   No current facility-administered medications for this visit.    Family History  Problem Relation Age of Onset  . Heart failure Mother   . Kidney disease Mother     ROS:  Pertinent items are noted in HPI.  Otherwise, a comprehensive ROS was negative.  Exam:   BP 144/86 mmHg  Pulse 76  Ht 5' 4.25" (1.632 m)  Wt 228 lb (103.42 kg)  BMI 38.83 kg/m2  LMP 03/26/2004 (Approximate) Height: 5' 4.25" (163.2 cm)  Ht Readings from Last 3 Encounters:  09/01/14 5' 4.25" (1.632 m)  08/26/13 5' 4.5" (1.638 m)    General appearance: alert, cooperative and appears stated age Head: Normocephalic, without obvious abnormality, atraumatic Neck: no adenopathy, supple, symmetrical, trachea midline and thyroid normal to inspection and palpation Lungs: clear to auscultation bilaterally Breasts: normal appearance, no masses or tenderness Heart: regular rate and rhythm Abdomen: soft, non-tender; no masses,  no organomegaly Extremities: extremities normal, atraumatic, no cyanosis or edema Skin: Skin color, texture, turgor normal. No rashes or lesions Lymph nodes: Cervical, supraclavicular, and axillary nodes normal. No abnormal inguinal nodes palpated Neurologic: Grossly normal   Pelvic: External genitalia:  no lesions              Urethra:  normal appearing urethra with no masses, tenderness or lesions  Bartholin's and Skene's: normal                 Vagina: normal appearing vagina with normal color and discharge, no lesions              Cervix: absent              Pap taken: No. Bimanual Exam:  Uterus:  uterus absent              Adnexa: no mass, fullness, tenderness               Rectovaginal: Confirms               Anus:  normal sphincter tone, no lesions  A:  Well Woman with normal exam  S/P TAH secondary to UTE fibroids 04/09/04, ovaries remain, no ERT History of Menorrhagia History of Hypertension  History of  hycholestrol History of OA bilateral knees and feet Vit D deficiency   P:   Reviewed health and wellness pertinent to exam  Pap smear not taken today  Mammogram is due 12/15  Refill Vit d and follow with labs  Does not need refill on Estrace cream at this time  Counseled with risk of DVT, CVA, cancer, etc.  Counseled on breast self exam, mammography screening, use and side effects of HRT, adequate intake of calcium and vitamin D, diet and exercise return annually or prn  An After Visit Summary was printed and given to the patient.

## 2014-09-01 NOTE — Patient Instructions (Signed)

## 2014-09-02 ENCOUNTER — Telehealth: Payer: Self-pay | Admitting: *Deleted

## 2014-09-02 LAB — VITAMIN D 25 HYDROXY (VIT D DEFICIENCY, FRACTURES): Vit D, 25-Hydroxy: 52 ng/mL (ref 30–100)

## 2014-09-02 NOTE — Telephone Encounter (Signed)
-----   Message from Milford Cage, [place] sent at 09/02/2014 12:08 PM EST ----- Let patient know that Vit D level is good at 52.  She can come off RX and go to OTC Vit D at 1000 IU daily.  Will recheck next year at AEX.

## 2014-09-02 NOTE — Telephone Encounter (Signed)
I have attempted to contact this patient by phone with the following results: left message to return call to Roscoe at [phone] on answering machine (mobile per [facility]). No personal information given. [phone] (Mobile)

## 2014-09-04 NOTE — Progress Notes (Signed)
Encounter reviewed by Dr. Cranford Blessinger Silva.  

## 2014-09-05 NOTE — Addendum Note (Signed)
Addended by: Graylon Good on: 09/05/2014 09:38 AM   Modules accepted: Medications

## 2014-09-05 NOTE — Telephone Encounter (Signed)
Pt notified in result note.  Med list updated to reflect changes below.

## 2014-10-12 ENCOUNTER — Encounter (HOSPITAL_COMMUNITY): Payer: Self-pay | Admitting: Emergency Medicine

## 2014-10-12 ENCOUNTER — Emergency Department (HOSPITAL_COMMUNITY)
Admission: EM | Admit: 2014-10-12 | Discharge: 2014-10-12 | Disposition: A | Discharge status: Home / Self Care | Payer: Federal, State, Local not specified - PPO | Source: Home / Self Care | Attending: Emergency Medicine | Admitting: Emergency Medicine

## 2014-10-12 DIAGNOSIS — J069 Acute upper respiratory infection, unspecified: Secondary | ICD-10-CM

## 2014-10-12 LAB — POCT RAPID STREP A: STREPTOCOCCUS, GROUP A SCREEN (DIRECT): NEGATIVE

## 2014-10-12 NOTE — ED Notes (Signed)
C/o cold sx onset 7 days Sx include: ST, congestion, fevers, hoarseness Denies SOB, wheezing Taking OTC cold meds w/no relief Alert, no signs of acute distress.

## 2014-10-12 NOTE — Discharge Instructions (Signed)
Rapid strep screen negative. Throat swab will be held for 3 day culture and if results indicate the need for additional treatment, you will be notified by phone. Plenty of fluids and rest. Tylenol as directed on packaging Delsym as directed on packaging for cough Expect improvement over the next 5-7 days.  Upper Respiratory Infection, Adult An upper respiratory infection (URI) is also sometimes known as the common cold. The upper respiratory tract includes the nose, sinuses, throat, trachea, and bronchi. Bronchi are the airways leading to the lungs. Most people improve within 1 week, but symptoms can last up to 2 weeks. A residual cough may last even longer.  CAUSES Many different viruses can infect the tissues lining the upper respiratory tract. The tissues become irritated and inflamed and often become very moist. Mucus production is also common. A cold is contagious. You can easily spread the virus to others by oral contact. This includes kissing, sharing a glass, coughing, or sneezing. Touching your mouth or nose and then touching a surface, which is then touched by another person, can also spread the virus. SYMPTOMS  Symptoms typically develop 1 to 3 days after you come in contact with a cold virus. Symptoms vary from person to person. They may include:  Runny nose.  Sneezing.  Nasal congestion.  Sinus irritation.  Sore throat.  Loss of voice (laryngitis).  Cough.  Fatigue.  Muscle aches.  Loss of appetite.  Headache.  Low-grade fever. DIAGNOSIS  You might diagnose your own cold based on familiar symptoms, since most people get a cold 2 to 3 times a year. Your caregiver can confirm this based on your exam. Most importantly, your caregiver can check that your symptoms are not due to another disease such as strep throat, sinusitis, pneumonia, asthma, or epiglottitis. Blood tests, throat tests, and X-rays are not necessary to diagnose a common cold, but they may sometimes be  helpful in excluding other more serious diseases. Your caregiver will decide if any further tests are required. RISKS AND COMPLICATIONS  You may be at risk for a more severe case of the common cold if you smoke cigarettes, have chronic heart disease (such as heart failure) or lung disease (such as asthma), or if you have a weakened immune system. The very young and very old are also at risk for more serious infections. Bacterial sinusitis, middle ear infections, and bacterial pneumonia can complicate the common cold. The common cold can worsen asthma and chronic obstructive pulmonary disease (COPD). Sometimes, these complications can require emergency medical care and may be life-threatening. PREVENTION  The best way to protect against getting a cold is to practice good hygiene. Avoid oral or hand contact with people with cold symptoms. Wash your hands often if contact occurs. There is no clear evidence that vitamin C, vitamin E, echinacea, or exercise reduces the chance of developing a cold. However, it is always recommended to get plenty of rest and practice good nutrition. TREATMENT  Treatment is directed at relieving symptoms. There is no cure. Antibiotics are not effective, because the infection is caused by a virus, not by bacteria. Treatment may include:  Increased fluid intake. Sports drinks offer valuable electrolytes, sugars, and fluids.  Breathing heated mist or steam (vaporizer or shower).  Eating chicken soup or other clear broths, and maintaining good nutrition.  Getting plenty of rest.  Using gargles or lozenges for comfort.  Controlling fevers with ibuprofen or acetaminophen as directed by your caregiver.  Increasing usage of your inhaler if you  have asthma. Zinc gel and zinc lozenges, taken in the first 24 hours of the common cold, can shorten the duration and lessen the severity of symptoms. Pain medicines may help with fever, muscle aches, and throat pain. A variety of  non-prescription medicines are available to treat congestion and runny nose. Your caregiver can make recommendations and may suggest nasal or lung inhalers for other symptoms.  HOME CARE INSTRUCTIONS   Only take over-the-counter or prescription medicines for pain, discomfort, or fever as directed by your caregiver.  Use a warm mist humidifier or inhale steam from a shower to increase air moisture. This may keep secretions moist and make it easier to breathe.  Drink enough water and fluids to keep your urine clear or pale yellow.  Rest as needed.  Return to work when your temperature has returned to normal or as your caregiver advises. You may need to stay home longer to avoid infecting others. You can also use a face mask and careful hand washing to prevent spread of the virus. SEEK MEDICAL CARE IF:   After the first few days, you feel you are getting worse rather than better.  You need your caregiver's advice about medicines to control symptoms.  You develop chills, worsening shortness of breath, or brown or red sputum. These may be signs of pneumonia.  You develop yellow or brown nasal discharge or pain in the face, especially when you bend forward. These may be signs of sinusitis.  You develop a fever, swollen neck glands, pain with swallowing, or white areas in the back of your throat. These may be signs of strep throat. SEEK IMMEDIATE MEDICAL CARE IF:   You have a fever.  You develop severe or persistent headache, ear pain, sinus pain, or chest pain.  You develop wheezing, a prolonged cough, cough up blood, or have a change in your usual mucus (if you have chronic lung disease).  You develop sore muscles or a stiff neck. Document Released: 03/26/2001 Document Revised: 12/23/2011 Document Reviewed: 01/05/2014 [facility] Patient Information 2015 [place], [place]. This information is not intended to replace advice given to you by your health care provider. Make sure you discuss any  questions you have with your health care provider.

## 2014-10-12 NOTE — ED Provider Notes (Signed)
CSN: [id number]     Arrival date & time 10/12/14  1235 History   First MD Initiated Contact with Patient 10/12/14 1332     Chief Complaint  Patient presents with  . URI   (Consider location/radiation/quality/duration/timing/severity/associated sxs/prior Treatment) Patient is a 58 y.o. female presenting with URI. The history is provided by the patient.  URI Presenting symptoms: congestion, cough, fatigue, fever, rhinorrhea and sore throat   Presenting symptoms: no ear pain and no facial pain   Severity:  Moderate Onset quality:  Gradual Duration:  6 days Timing:  Constant Progression:  Improving Chronicity:  New Associated symptoms: headaches   Associated symptoms: no arthralgias, no myalgias, no neck pain, no sinus pain, no sneezing, no swollen glands and no wheezing   Risk factors: sick contacts   Risk factors: no immunosuppression   Risk factors comment:  Granddaughter ill with same   Past Medical History  Diagnosis Date  . Fibroid   . OA (osteoarthritis) of knee     bilateral  . Hyperlipidemia   . Vitamin D deficiency disease   . Hypertension     not on meds, working on diet and exercise   Past Surgical History  Procedure Laterality Date  . Breast reduction surgery Bilateral 2000  . Breast surgery Left 1972    biopsy benign  . Cholecystectomy, laparoscopic  04/21/11  . Abdominal hysterectomy  04/09/04    fibroids, ovaries remain  . Tonsillectomy  age 4   Family History  Problem Relation Age of Onset  . Heart failure Mother   . Kidney disease Mother    History  Substance Use Topics  . Smoking status: Never Smoker   . Smokeless tobacco: Never Used  . Alcohol Use: No   OB History    Gravida Para Term Preterm AB TAB SAB Ectopic Multiple Living   1 0 0 0 1 1    0     Review of Systems  Constitutional: Positive for fever and fatigue.  HENT: Positive for congestion, rhinorrhea and sore throat. Negative for ear pain and sneezing.   Respiratory: Positive for  cough. Negative for wheezing.   Musculoskeletal: Negative for myalgias, arthralgias and neck pain.  Neurological: Positive for headaches.  All other systems reviewed and are negative.   Allergies  Review of patient's allergies indicates no known allergies.  Home Medications   Prior to Admission medications   Medication Sig Start Date End Date Taking? Authorizing Provider  simvastatin (ZOCOR) 40 MG tablet Take 1 tablet by mouth every other day. 08/20/13  Yes Historical Provider, MD  triamterene-hydrochlorothiazide (MAXZIDE-25) 37.5-25 MG per tablet Take 0.5 tablets by mouth daily. 08/04/14  Yes Historical Provider, MD  cholecalciferol (VITAMIN D) 1000 UNITS tablet Take 1,000 Units by mouth daily.    Historical Provider, MD  hydrocortisone (ANUSOL-HC) 25 MG suppository Place 1 suppository (25 mg total) rectally 2 (two) times daily. 09/13/13   Milford Cage, FNP  Omega-3 Fatty Acids (FISH OIL) 1000 MG CAPS Take 1 capsule by mouth daily.    Historical Provider, MD   BP 164/78 mmHg  Pulse 108  Temp(Src) 99.5 F (37.5 C) (Oral)  Resp 16  SpO2 97%  LMP 03/26/2004 (Approximate) Physical Exam  Constitutional: She is oriented to person, place, and time. She appears well-developed and well-nourished. No distress.  HENT:  Head: Normocephalic and atraumatic.  Right Ear: Hearing, tympanic membrane, external ear and ear canal normal.  Left Ear: Hearing, tympanic membrane, external ear and ear canal normal.  Nose:  Nose normal.  Mouth/Throat: Uvula is midline, oropharynx is clear and moist and mucous membranes are normal.  Eyes: Conjunctivae are normal. No scleral icterus.  Neck: Normal range of motion. Neck supple.  Cardiovascular: Normal rate, regular rhythm and normal heart sounds.   Pulmonary/Chest: Effort normal and breath sounds normal. No respiratory distress. She has no wheezes.  Musculoskeletal: Normal range of motion.  Lymphadenopathy:    She has no cervical adenopathy.   Neurological: She is alert and oriented to person, place, and time.  Skin: Skin is warm and dry.  Psychiatric: She has a normal mood and affect.  Nursing note and vitals reviewed.   ED Course  Procedures (including critical care time) Labs Review Labs Reviewed  POCT RAPID STREP A ([facility] CARE ONLY)    Imaging Review No results found.   MDM   1. URI (upper respiratory infection)   Rapid strep screen negative. Throat swab will be held for 3 day culture and if results indicate the need for additional treatment, you will be notified by phone. Plenty of fluids and rest. Tylenol as directed on packaging Delsym as directed on packaging for cough Expect improvement over the next 5-7 days.    Lutricia Feil, [place] 10/12/14 [phone]

## 2014-10-16 LAB — CULTURE, GROUP A STREP

## 2014-10-16 NOTE — ED Notes (Signed)
Final report of strep screening negative for group A&B

## 2015-01-10 ENCOUNTER — Other Ambulatory Visit: Payer: Self-pay | Admitting: Nurse Practitioner

## 2015-01-10 NOTE — Telephone Encounter (Signed)
Medication refill request: Vitamin D 50,000 iu's Last AEX:  09/01/14 with PG  Next AEX: 09/14/15 with PG  Last Vitamin D level checked: 09/01/14 at 52 Refill authorized: Please advise

## 2015-01-27 ENCOUNTER — Telehealth: Payer: Self-pay | Admitting: Nurse Practitioner

## 2015-01-27 NOTE — Telephone Encounter (Signed)
Left patient a message to call back to reschedule a future appointment that was cancelled by the provider. °

## 2015-09-14 ENCOUNTER — Ambulatory Visit: Payer: Federal, State, Local not specified - PPO | Admitting: Nurse Practitioner

## 2015-09-19 ENCOUNTER — Encounter: Payer: Self-pay | Admitting: Nurse Practitioner

## 2015-09-19 ENCOUNTER — Ambulatory Visit (INDEPENDENT_AMBULATORY_CARE_PROVIDER_SITE_OTHER): Payer: Federal, State, Local not specified - PPO | Admitting: Nurse Practitioner

## 2015-09-19 VITALS — BP 120/76 | HR 84 | Ht 64.25 in | Wt 222.0 lb

## 2015-09-19 DIAGNOSIS — Z9289 Personal history of other medical treatment: Secondary | ICD-10-CM | POA: Diagnosis not present

## 2015-09-19 DIAGNOSIS — E559 Vitamin D deficiency, unspecified: Secondary | ICD-10-CM | POA: Diagnosis not present

## 2015-09-19 DIAGNOSIS — Z01419 Encounter for gynecological examination (general) (routine) without abnormal findings: Secondary | ICD-10-CM | POA: Diagnosis not present

## 2015-09-19 MED ORDER — VITAMIN D (ERGOCALCIFEROL) 1.25 MG (50000 UNIT) PO CAPS: 50000.0000 [IU] | ORAL_CAPSULE | ORAL | Status: DC

## 2015-09-19 NOTE — Patient Instructions (Signed)

## 2015-09-19 NOTE — Progress Notes (Signed)
Scheduled patient while in office for bilateral screening mammogram at the [facility]. Appointment scheduled for 12/22 at 2:40 pm. Agreeable to date and time.

## 2015-09-19 NOTE — Progress Notes (Signed)
Patient ID: Shirley Kent, female   DOB: [birth year], 59 y.o.   MRN: [id number]  59 y.o. G1P0010 Married  African American Fe here for annual exam.  No vaso symptoms or vaginal dryness. No new diagnosis. Still has not done her Mammogram.  She does not use any vaginal estrogen cream any more.  Husband has had prostate surgery and is doing well.  He is now retired X 6 years.  She may retire early at [age] as he would like to travel.  Patient's last menstrual period was 03/26/2004 (approximate).          Sexually active: No.  The current method of family planning is status post hysterectomy.    Exercising: No.  The patient does not participate in regular exercise at present. Smoker:  no  Health Maintenance: Pap: 03/01/04, WNL MMG: 10/11/13, Bi-Rads 1:  Negative Colonoscopy: 03/27/12, normal, repeat 5 years BMD: never TDaP: 6/11 Labs: PCP takes care of all labs    reports that she has never smoked. She has never used smokeless tobacco. She reports that she does not drink alcohol or use illicit drugs.  Past Medical History  Diagnosis Date  . Fibroid   . OA (osteoarthritis) of knee     bilateral  . Hyperlipidemia   . Vitamin D deficiency disease   . Hypertension     not on meds, working on diet and exercise    Past Surgical History  Procedure Laterality Date  . Breast reduction surgery Bilateral 2000  . Breast surgery Left 1972    biopsy benign  . Cholecystectomy, laparoscopic  04/21/11  . Abdominal hysterectomy  04/09/04    fibroids, ovaries remain  . Tonsillectomy  age 70    Current Outpatient Prescriptions  Medication Sig Dispense Refill  . metFORMIN (GLUCOPHAGE) 500 MG tablet Take 1 tablet by mouth 2 (two) times daily.  3  . rosuvastatin (CRESTOR) 10 MG tablet Take 1 tablet by mouth daily.    . hydrocortisone (ANUSOL-HC) 25 MG suppository Place 1 suppository (25 mg total) rectally 2 (two) times daily. 24 suppository 3  . Omega-3 Fatty Acids (FISH OIL) 1000 MG CAPS Take 1  capsule by mouth daily.    Marland Kitchen triamterene-hydrochlorothiazide (MAXZIDE-25) 37.5-25 MG per tablet Take 0.5 tablets by mouth daily.  2  . Vitamin D, Ergocalciferol, (DRISDOL) 50000 UNITS CAPS capsule Take 1 capsule (50,000 Units total) by mouth once a week. 30 capsule 2   No current facility-administered medications for this visit.    Family History  Problem Relation Age of Onset  . Heart failure Mother   . Kidney disease Mother   . Multiple sclerosis Sister 39    ROS:  Pertinent items are noted in HPI.  Otherwise, a comprehensive ROS was negative.  Exam:   BP 120/76 mmHg  Pulse 84  Ht 5' 4.25" (1.632 m)  Wt 222 lb (100.699 kg)  BMI 37.81 kg/m2  LMP 03/26/2004 (Approximate) Height: 5' 4.25" (163.2 cm) Ht Readings from Last 3 Encounters:  09/19/15 5' 4.25" (1.632 m)  09/01/14 5' 4.25" (1.632 m)  08/26/13 5' 4.5" (1.638 m)    General appearance: alert, cooperative and appears stated age Head: Normocephalic, without obvious abnormality, atraumatic Neck: no adenopathy, supple, symmetrical, trachea midline and thyroid normal to inspection and palpation Lungs: clear to auscultation bilaterally Breasts: normal appearance, no masses or tenderness, positive findings: breast reduction surgery Heart: regular rate and rhythm Abdomen: soft, non-tender; no masses,  no organomegaly Extremities: extremities normal, atraumatic, no cyanosis or  edema Skin: Skin color, texture, turgor normal. No rashes or lesions Lymph nodes: Cervical, supraclavicular, and axillary nodes normal. No abnormal inguinal nodes palpated Neurologic: Grossly normal   Pelvic: External genitalia:  no lesions              Urethra:  normal appearing urethra with no masses, tenderness or lesions              Bartholin's and Skene's: normal                 Vagina: normal appearing vagina with normal color and discharge, no lesions              Cervix: absent              Pap taken: No. Bimanual Exam:  Uterus:  uterus  absent              Adnexa: no mass, fullness, tenderness               Rectovaginal: Confirms               Anus:  normal sphincter tone, no lesions  Chaperone present: no  A:  Well Woman with normal exam  S/P TAH secondary to UTE fibroids 04/09/04, ovaries remain, no ERT History of Menorrhagia History of Hypertension History of hypercholesterolemia History of OA bilateral knees and feet Vit D deficiency   P:   Reviewed health and wellness pertinent to exam  Pap smear as above  Mammogram is past due and will schedule today  Refill on Vit D and will follow with labs  Counseled on breast self exam, mammography screening, use and side effects of HRT, adequate intake of calcium and vitamin D, diet and exercise return annually or prn  An After Visit Summary was printed and given to the patient.

## 2015-09-20 LAB — HIV ANTIBODY (ROUTINE TESTING W REFLEX): HIV 1&2 Ab, 4th Generation: NONREACTIVE

## 2015-09-20 LAB — HEPATITIS C ANTIBODY: HCV Ab: NEGATIVE

## 2015-09-20 LAB — VITAMIN D 25 HYDROXY (VIT D DEFICIENCY, FRACTURES): Vit D, 25-Hydroxy: 47 ng/mL (ref 30–100)

## 2015-09-20 NOTE — Progress Notes (Signed)
Encounter reviewed Lacey Dotson, MD   

## 2015-09-22 ENCOUNTER — Telehealth: Payer: Self-pay | Admitting: Nurse Practitioner

## 2015-09-22 NOTE — Telephone Encounter (Signed)
Patient says she thought someone tried to call her today. No message in system.

## 2015-09-25 NOTE — Telephone Encounter (Signed)
Patient returned call and she is given message. Will follow up prn. Routing to provider for final review. Patient agreeable to disposition. Will close encounter.

## 2015-09-25 NOTE — Telephone Encounter (Signed)
Patient was contacted on 09/22/15 to give results.   Notes Recorded by Graylon Good, CMA on 09/22/2015 at 11:53 AM Pt notified of HepC and HIV ("tests done to complete CDC recommendations") and Vit Dresults via [phone] (Mobile) per DPR. Pt advised in message to call with any questions.  Message left to return call to [place] at [phone].

## 2015-10-05 ENCOUNTER — Ambulatory Visit: Payer: Federal, State, Local not specified - PPO

## 2015-10-11 ENCOUNTER — Ambulatory Visit
Admission: RE | Admit: 2015-10-11 | Discharge: 2015-10-11 | Disposition: A | Discharge status: Home / Self Care | Payer: Federal, State, Local not specified - PPO | Source: Ambulatory Visit | Attending: Nurse Practitioner | Admitting: Nurse Practitioner

## 2015-10-11 DIAGNOSIS — Z9289 Personal history of other medical treatment: Secondary | ICD-10-CM

## 2016-03-21 DIAGNOSIS — E669 Obesity, unspecified: Secondary | ICD-10-CM | POA: Diagnosis not present

## 2016-03-21 DIAGNOSIS — Z0000 Encounter for general adult medical examination without abnormal findings: Secondary | ICD-10-CM | POA: Diagnosis not present

## 2016-03-21 DIAGNOSIS — I10 Essential (primary) hypertension: Secondary | ICD-10-CM | POA: Diagnosis not present

## 2016-03-21 DIAGNOSIS — E782 Mixed hyperlipidemia: Secondary | ICD-10-CM | POA: Diagnosis not present

## 2016-03-21 DIAGNOSIS — M2550 Pain in unspecified joint: Secondary | ICD-10-CM | POA: Diagnosis not present

## 2016-09-20 DIAGNOSIS — I10 Essential (primary) hypertension: Secondary | ICD-10-CM | POA: Diagnosis not present

## 2016-09-20 DIAGNOSIS — E782 Mixed hyperlipidemia: Secondary | ICD-10-CM | POA: Diagnosis not present

## 2016-09-20 DIAGNOSIS — R7309 Other abnormal glucose: Secondary | ICD-10-CM | POA: Diagnosis not present

## 2016-09-23 ENCOUNTER — Encounter: Payer: Self-pay | Admitting: Nurse Practitioner

## 2016-09-23 ENCOUNTER — Ambulatory Visit (INDEPENDENT_AMBULATORY_CARE_PROVIDER_SITE_OTHER): Payer: Federal, State, Local not specified - PPO | Admitting: Nurse Practitioner

## 2016-09-23 VITALS — BP 124/72 | HR 72 | Ht 64.25 in | Wt 218.0 lb

## 2016-09-23 DIAGNOSIS — Z01419 Encounter for gynecological examination (general) (routine) without abnormal findings: Secondary | ICD-10-CM

## 2016-09-23 DIAGNOSIS — E2839 Other primary ovarian failure: Secondary | ICD-10-CM | POA: Diagnosis not present

## 2016-09-23 DIAGNOSIS — E559 Vitamin D deficiency, unspecified: Secondary | ICD-10-CM

## 2016-09-23 MED ORDER — VITAMIN D (ERGOCALCIFEROL) 1.25 MG (50000 UNIT) PO CAPS: 50000.0000 [IU] | ORAL_CAPSULE | ORAL | 3 refills | Status: DC

## 2016-09-23 MED ORDER — HYDROCORTISONE ACETATE 25 MG RE SUPP: 25.0000 mg | Freq: Two times a day (BID) | RECTAL | 3 refills | Status: DC

## 2016-09-23 NOTE — Patient Instructions (Addendum)

## 2016-09-23 NOTE — Progress Notes (Signed)
Patient ID: Shirley Kent, female   DOB: 1956/08/24, 60 y.o.   MRN: [id number]  61 y.o. G1P0010 Married African American Fe here for annual exam. Doing well.  husband had another PSA abnormal result after he has already been treated for prostate cancer with seeds.  They are waiting for further recommendations.  She may decide to retire at 89.  Patient's last menstrual period was 03/26/2004 (approximate).          Sexually active: No.  The current method of family planning is status post hysterectomy.    Exercising: No.  The patient does not participate in regular exercise at present. Smoker:  no  Health Maintenance: Pap: 03/01/04, WNL MMG: 10/11/15, Bi-Rads 1:  Negative Colonoscopy: 03/27/12, normal, repeat 5 years BMD: never TDaP: 03/14/10 Shingles: Never will check with pharmacy Hep C and HIV: 09/19/15 Labs: PCP takes care of labs, we follow Vitamin D   reports that she has never smoked. She has never used smokeless tobacco. She reports that she does not drink alcohol or use drugs.  Past Medical History:  Diagnosis Date  . Fibroid   . Hyperlipidemia   . Hypertension    not on meds, working on diet and exercise  . OA (osteoarthritis) of knee    bilateral  . Vitamin D deficiency disease     Past Surgical History:  Procedure Laterality Date  . ABDOMINAL HYSTERECTOMY  04/09/04   fibroids, ovaries remain  . BREAST REDUCTION SURGERY Bilateral 2000  . BREAST SURGERY Left 1972   biopsy benign  . CHOLECYSTECTOMY, LAPAROSCOPIC  04/21/11  . TONSILLECTOMY  age 29    Current Outpatient Prescriptions  Medication Sig Dispense Refill  . amLODipine (NORVASC) 5 MG tablet Take 1 tablet by mouth daily.    . hydrocortisone (ANUSOL-HC) 25 MG suppository Place 1 suppository (25 mg total) rectally 2 (two) times daily. 24 suppository 3  . metFORMIN (GLUCOPHAGE) 500 MG tablet Take 1 tablet by mouth 2 (two) times daily.  3  . Omega-3 Fatty Acids (FISH OIL) 1000 MG CAPS Take 1 capsule by mouth  daily.    . rosuvastatin (CRESTOR) 10 MG tablet Take 1 tablet by mouth daily.    . Vitamin D, Ergocalciferol, (DRISDOL) 50000 UNITS CAPS capsule Take 1 capsule (50,000 Units total) by mouth once a week. 30 capsule 2   No current facility-administered medications for this visit.     Family History  Problem Relation Age of Onset  . Heart failure Mother   . Kidney disease Mother   . Multiple sclerosis Sister 39    ROS:  Pertinent items are noted in HPI.  Otherwise, a comprehensive ROS was negative.  Exam:   BP 124/72 (BP Location: Right Arm, Patient Position: Sitting, Cuff Size: Large)   Pulse 72   Ht 5' 4.25" (1.632 m)   Wt 218 lb (98.9 kg)   LMP 03/26/2004 (Approximate)   BMI 37.13 kg/m  Height: 5' 4.25" (163.2 cm) Ht Readings from Last 3 Encounters:  09/23/16 5' 4.25" (1.632 m)  09/19/15 5' 4.25" (1.632 m)  09/01/14 5' 4.25" (1.632 m)    General appearance: alert, cooperative and appears stated age Head: Normocephalic, without obvious abnormality, atraumatic Neck: no adenopathy, supple, symmetrical, trachea midline and thyroid normal to inspection and palpation Lungs: clear to auscultation bilaterally Breasts: normal appearance, no masses or tenderness Heart: regular rate and rhythm Abdomen: soft, non-tender; no masses,  no organomegaly Extremities: extremities normal, atraumatic, no cyanosis or edema Skin: Skin color, texture, turgor  normal. No rashes or lesions Lymph nodes: Cervical, supraclavicular, and axillary nodes normal. No abnormal inguinal nodes palpated Neurologic: Grossly normal   Pelvic: External genitalia:  no lesions              Urethra:  normal appearing urethra with no masses, tenderness or lesions              Bartholin's and Skene's: normal                 Vagina: normal appearing vagina with normal color and discharge, no lesions              Cervix: absent              Pap taken: No. Bimanual Exam:  Uterus:  uterus absent              Adnexa:  no mass, fullness, tenderness               Rectovaginal: Confirms               Anus:  normal sphincter tone, no lesions - no flare of hemorrhoids  Chaperone present: yes  A:  Well Woman with normal exam  S/P TAH secondary to UTE fibroids 04/09/04, ovaries remain, no ERT History of Menorrhagia History of Hypertension History of hypercholesterolemia History of OA bilateral knees and feet Vit D deficiency    P:   Reviewed health and wellness pertinent to exam  Pap smear as above  Mammogram is due 10/10/17  Will refill Vit D and follow with labs  Will refill Anusol HC to have on hand for prn use  Counseled on breast self exam, mammography screening, adequate intake of calcium and vitamin D, diet and exercise, Kegel's exercises return annually or prn  An After Visit Summary was printed and given to the patient.

## 2016-09-24 LAB — VITAMIN D 25 HYDROXY (VIT D DEFICIENCY, FRACTURES): Vit D, 25-Hydroxy: 59 ng/mL (ref 30–100)

## 2016-09-26 NOTE — Progress Notes (Signed)
Encounter reviewed by Dr. Brook Amundson C. Silva.  

## 2016-09-30 ENCOUNTER — Other Ambulatory Visit: Payer: Self-pay | Admitting: Nurse Practitioner

## 2017-03-26 DIAGNOSIS — I10 Essential (primary) hypertension: Secondary | ICD-10-CM | POA: Diagnosis not present

## 2017-03-26 DIAGNOSIS — Z0000 Encounter for general adult medical examination without abnormal findings: Secondary | ICD-10-CM | POA: Diagnosis not present

## 2017-03-26 DIAGNOSIS — Z1231 Encounter for screening mammogram for malignant neoplasm of breast: Secondary | ICD-10-CM | POA: Diagnosis not present

## 2017-03-26 DIAGNOSIS — R7309 Other abnormal glucose: Secondary | ICD-10-CM | POA: Diagnosis not present

## 2017-03-26 DIAGNOSIS — E669 Obesity, unspecified: Secondary | ICD-10-CM | POA: Diagnosis not present

## 2017-03-26 DIAGNOSIS — E782 Mixed hyperlipidemia: Secondary | ICD-10-CM | POA: Diagnosis not present

## 2017-03-31 ENCOUNTER — Other Ambulatory Visit: Payer: Self-pay | Admitting: Nurse Practitioner

## 2017-03-31 DIAGNOSIS — Z1231 Encounter for screening mammogram for malignant neoplasm of breast: Secondary | ICD-10-CM

## 2017-04-01 DIAGNOSIS — Z86010 Personal history of colonic polyps: Secondary | ICD-10-CM | POA: Diagnosis not present

## 2017-04-01 DIAGNOSIS — Z1211 Encounter for screening for malignant neoplasm of colon: Secondary | ICD-10-CM | POA: Diagnosis not present

## 2017-04-10 ENCOUNTER — Ambulatory Visit
Admission: RE | Admit: 2017-04-10 | Discharge: 2017-04-10 | Disposition: A | Discharge status: Home / Self Care | Payer: Federal, State, Local not specified - PPO | Source: Ambulatory Visit | Attending: Nurse Practitioner | Admitting: Nurse Practitioner

## 2017-04-10 DIAGNOSIS — Z1231 Encounter for screening mammogram for malignant neoplasm of breast: Secondary | ICD-10-CM

## 2017-05-05 DIAGNOSIS — D120 Benign neoplasm of cecum: Secondary | ICD-10-CM | POA: Diagnosis not present

## 2017-05-05 DIAGNOSIS — Z1211 Encounter for screening for malignant neoplasm of colon: Secondary | ICD-10-CM | POA: Diagnosis not present

## 2017-05-05 DIAGNOSIS — K635 Polyp of colon: Secondary | ICD-10-CM | POA: Diagnosis not present

## 2017-09-23 ENCOUNTER — Ambulatory Visit: Payer: Federal, State, Local not specified - PPO | Admitting: Nurse Practitioner

## 2017-09-24 ENCOUNTER — Ambulatory Visit: Payer: Federal, State, Local not specified - PPO | Admitting: Certified Nurse Midwife

## 2017-09-25 DIAGNOSIS — E782 Mixed hyperlipidemia: Secondary | ICD-10-CM | POA: Diagnosis not present

## 2017-09-25 DIAGNOSIS — I10 Essential (primary) hypertension: Secondary | ICD-10-CM | POA: Diagnosis not present

## 2017-09-25 DIAGNOSIS — R7303 Prediabetes: Secondary | ICD-10-CM | POA: Diagnosis not present

## 2017-10-01 ENCOUNTER — Encounter: Payer: Self-pay | Admitting: Certified Nurse Midwife

## 2017-10-01 ENCOUNTER — Other Ambulatory Visit: Payer: Self-pay

## 2017-10-01 ENCOUNTER — Ambulatory Visit: Payer: Federal, State, Local not specified - PPO | Admitting: Certified Nurse Midwife

## 2017-10-01 VITALS — BP 120/70 | HR 70 | Resp 16 | Ht 64.0 in | Wt 221.0 lb

## 2017-10-01 DIAGNOSIS — N951 Menopausal and female climacteric states: Secondary | ICD-10-CM

## 2017-10-01 DIAGNOSIS — Z01419 Encounter for gynecological examination (general) (routine) without abnormal findings: Secondary | ICD-10-CM | POA: Diagnosis not present

## 2017-10-01 DIAGNOSIS — Z8679 Personal history of other diseases of the circulatory system: Secondary | ICD-10-CM

## 2017-10-01 NOTE — Progress Notes (Signed)
61 y.o. G1P0010 Married  African American Fe here for annual exam. Menopausal no HRT. Denies vaginal bleeding or vaginal dryness. Sees PCP twice yearly for diabetes/hypertension /cholesterol/Vitamin D management and labs. Working on weight control and loss, to help with diabetes.  Sister with MS doing well. No health issues today. Ready for Christmas!  Patient's last menstrual period was 03/26/2004 (approximate).          Sexually active: No.  The current method of family planning is status post hysterectomy.    Exercising: Yes.    walking Smoker:  no  Health Maintenance: Pap:  2005 neg History of Abnormal Pap: no MMG:  04-10-17 category a density birads 1:neg Self Breast exams: yes Colonoscopy:  2018 f/u 74yrs polyp removed BMD:   none TDaP:  2011 Shingles: not done Pneumonia: not done Hep C and HIV: both neg 2016 Labs: no   reports that  has never smoked. she has never used smokeless tobacco. She reports that she does not drink alcohol or use drugs.  Past Medical History:  Diagnosis Date  . Fibroid   . Hyperlipidemia   . Hypertension    not on meds, working on diet and exercise  . OA (osteoarthritis) of knee    bilateral  . Vitamin D deficiency disease     Past Surgical History:  Procedure Laterality Date  . ABDOMINAL HYSTERECTOMY  04/09/04   fibroids, ovaries remain  . BREAST BIOPSY    . BREAST REDUCTION SURGERY Bilateral 2000  . BREAST SURGERY Left 1972   biopsy benign  . CHOLECYSTECTOMY, LAPAROSCOPIC  04/21/11  . REDUCTION MAMMAPLASTY Bilateral   . TONSILLECTOMY  age 74    Current Outpatient Medications  Medication Sig Dispense Refill  . amLODipine (NORVASC) 5 MG tablet Take 1 tablet by mouth daily.    . hydrocortisone (ANUSOL-HC) 25 MG suppository Place 1 suppository (25 mg total) rectally 2 (two) times daily. (Patient taking differently: Place 25 mg rectally 2 (two) times daily as needed. ) 24 suppository 3  . metFORMIN (GLUCOPHAGE) 500 MG tablet Take 1 tablet by  mouth 2 (two) times daily.  3  . Omega-3 Fatty Acids (FISH OIL) 1000 MG CAPS Take 1 capsule by mouth daily.    . rosuvastatin (CRESTOR) 10 MG tablet Take 1 tablet by mouth daily.    . Vitamin D, Ergocalciferol, (DRISDOL) 50000 units CAPS capsule Take 1 capsule (50,000 Units total) by mouth once a week. (Patient taking differently: Take 50,000 Units by mouth every 14 (fourteen) days. ) 30 capsule 3   No current facility-administered medications for this visit.     Family History  Problem Relation Age of Onset  . Heart failure Mother   . Kidney disease Mother   . Multiple sclerosis Sister 23  . Breast cancer Neg Hx     ROS:  Pertinent items are noted in HPI.  Otherwise, a comprehensive ROS was negative.  Exam:   BP 120/70   Pulse 70   Resp 16   Ht 5\' 4"  (1.626 m)   Wt 221 lb (100.2 kg)   LMP 03/26/2004 (Approximate)   BMI 37.93 kg/m  Height: 5\' 4"  (162.6 cm) Ht Readings from Last 3 Encounters:  10/01/17 5\' 4"  (1.626 m)  09/23/16 5' 4.25" (1.632 m)  09/19/15 5' 4.25" (1.632 m)    General appearance: alert, cooperative and appears stated age Head: Normocephalic, without obvious abnormality, atraumatic Neck: no adenopathy, supple, symmetrical, trachea midline and thyroid normal to inspection and palpation Lungs: clear to  auscultation bilaterally Breasts: normal appearance, no masses or tenderness, No nipple retraction or dimpling, No nipple discharge or bleeding, No axillary or supraclavicular adenopathy. Reduction scarring noted Heart: regular rate and rhythm Abdomen: soft, non-tender; no masses,  no organomegaly Extremities: extremities normal, atraumatic, no cyanosis or edema Skin: Skin color, texture, turgor normal. No rashes or lesions Lymph nodes: Cervical, supraclavicular, and axillary nodes normal. No abnormal inguinal nodes palpated Neurologic: Grossly normal   Pelvic: External genitalia:  no lesions              Urethra:  normal appearing urethra with no masses,  tenderness or lesions              Bartholin's and Skene's: normal                 Vagina: normal appearing vagina with normal color and discharge, no lesions              Cervix: absent              Pap taken: No. Bimanual Exam:  Uterus:  uterus absent              Adnexa: normal adnexa and no mass, fullness, tenderness               Rectovaginal: Confirms               Anus:  normal sphincter tone, no lesions  Chaperone present: yes  A:  Well Woman with normal exam  Menopausal no HRT S/P TAH for fibroids and bleeding, ovaries retained  Hypertension/cholesterol/vitamin D with MD management  Obesity working on weight control  BMD due P:   Reviewed health and wellness pertinent to exam  Aware of need to advise if vaginal bleeding  Continue follow up with PCP as indicated  Continue her weight loss journey  Patient will schedule BMD with next mammogram  Pap smear: no   counseled on breast self exam, mammography screening, feminine hygiene, osteoporosis, adequate intake of calcium and vitamin D, diet and exercise, Kegel's exercises  return annually or prn  An After Visit Summary was printed and given to the patient.

## 2017-10-01 NOTE — Patient Instructions (Signed)

## 2017-10-24 ENCOUNTER — Other Ambulatory Visit: Payer: Self-pay

## 2017-10-24 NOTE — Telephone Encounter (Signed)
Medication refill request: Vitamin D Last AEX:  10/01/17 DL Next AEX: 10/01/18  Last MMG (if hormonal medication request): 04/10/17 BIRADS 1 negative/density a Refill authorized: 09/23/16 #30 w/3 refills; today please advise

## 2017-10-27 NOTE — Telephone Encounter (Signed)
Spoke with patient, who states that she has had Vitamin D level checked here in the past. Would like to continue having level checked here. Patient states that she has another month of the Vitamin D and would like to schedule lab appointment once she has completed the rest of her pills.

## 2017-10-27 NOTE — Telephone Encounter (Signed)
My note says she is having Vitamin D checked with PCP. I need to know what her level is to refill. If not done with PCP needs to come in for level for refill.

## 2017-10-28 ENCOUNTER — Other Ambulatory Visit: Payer: Self-pay | Admitting: Certified Nurse Midwife

## 2017-10-28 DIAGNOSIS — E559 Vitamin D deficiency, unspecified: Secondary | ICD-10-CM

## 2017-10-28 NOTE — Telephone Encounter (Signed)
That will be fine will place future order if you will schedule her

## 2018-04-02 DIAGNOSIS — Z1231 Encounter for screening mammogram for malignant neoplasm of breast: Secondary | ICD-10-CM | POA: Diagnosis not present

## 2018-04-02 DIAGNOSIS — E782 Mixed hyperlipidemia: Secondary | ICD-10-CM | POA: Diagnosis not present

## 2018-04-02 DIAGNOSIS — Z1389 Encounter for screening for other disorder: Secondary | ICD-10-CM | POA: Diagnosis not present

## 2018-04-02 DIAGNOSIS — H40033 Anatomical narrow angle, bilateral: Secondary | ICD-10-CM | POA: Diagnosis not present

## 2018-04-02 DIAGNOSIS — E669 Obesity, unspecified: Secondary | ICD-10-CM | POA: Diagnosis not present

## 2018-04-02 DIAGNOSIS — Z0000 Encounter for general adult medical examination without abnormal findings: Secondary | ICD-10-CM | POA: Diagnosis not present

## 2018-04-02 DIAGNOSIS — R7303 Prediabetes: Secondary | ICD-10-CM | POA: Diagnosis not present

## 2018-04-02 DIAGNOSIS — E559 Vitamin D deficiency, unspecified: Secondary | ICD-10-CM | POA: Diagnosis not present

## 2018-04-02 DIAGNOSIS — H04123 Dry eye syndrome of bilateral lacrimal glands: Secondary | ICD-10-CM | POA: Diagnosis not present

## 2018-04-02 DIAGNOSIS — I10 Essential (primary) hypertension: Secondary | ICD-10-CM | POA: Diagnosis not present

## 2018-05-15 ENCOUNTER — Other Ambulatory Visit: Payer: Self-pay | Admitting: Nurse Practitioner

## 2018-05-15 DIAGNOSIS — Z1231 Encounter for screening mammogram for malignant neoplasm of breast: Secondary | ICD-10-CM

## 2018-06-10 ENCOUNTER — Ambulatory Visit
Admission: RE | Admit: 2018-06-10 | Discharge: 2018-06-10 | Disposition: A | Discharge status: Home / Self Care | Payer: Federal, State, Local not specified - PPO | Source: Ambulatory Visit | Attending: Nurse Practitioner | Admitting: Nurse Practitioner

## 2018-06-10 ENCOUNTER — Telehealth: Payer: Self-pay | Admitting: Certified Nurse Midwife

## 2018-06-10 DIAGNOSIS — Z1231 Encounter for screening mammogram for malignant neoplasm of breast: Secondary | ICD-10-CM | POA: Diagnosis not present

## 2018-06-10 NOTE — Telephone Encounter (Signed)
Left message to call Sharee Pimple at [phone].  Reviewed with Melvia Heaps, CNM. Vitamin D 53.6, within normal limits.  Recommends OTC Vit D3 2,000 IUD daily to maintain.   Lab results to scan.

## 2018-06-10 NOTE — Telephone Encounter (Signed)
Spoke with patient, advised as seen below per Melvia Heaps, CNM. Patient agreeable to taking OTC Vitamin D, verbalizes understanding and agreeable to plan.   See telephone encounter dated 06/10/18 Reviewed with Melvia Heaps, CNM. Vitamin D 53.6, within normal limits.  Recommends OTC Vit D3 2,000 IUD daily to maintain.   Lab results to scan.   Routing to provider for final review. Patient is agreeable to disposition. Will close encounter.

## 2018-06-10 NOTE — Telephone Encounter (Signed)
Results received from PCP, DOS 04/02/18.   Vitamin D 53.6  Results to Melvia Heaps, CNM to review and advise.

## 2018-06-10 NOTE — Telephone Encounter (Signed)
Left message with medical records, requesting copy of patients recent labs. Please return call to Sharee Pimple, RN at [facility] or fax results to [phone].

## 2018-06-10 NOTE — Telephone Encounter (Signed)
Patient stated that she was told that she must have blood work done before she can get a refill of the vitamin D. Patient stated that she had blood work done at her pcp in June and is wondering if that can be accessed and used, so that she does not have to get it done again.

## 2018-06-10 NOTE — Telephone Encounter (Signed)
Spoke with patient.   1. Requesting refill of Vitamin D 50,000 IU to [facility].    2. Patient states she had vitamin D labs with PCP/ [name], NP at [facility] in June 2019. Advised I will call to request copy of labs, will then review with Melvia Heaps, CNM and return call to advise on refill. Patient verbalizes understanding.   Last AEX 10/01/17 Next AEX 10/01/18

## 2018-06-10 NOTE — Telephone Encounter (Signed)
Patient is returning a call to [place]. Earlier encounter closed in error.

## 2018-07-14 ENCOUNTER — Other Ambulatory Visit: Payer: Self-pay | Admitting: Nurse Practitioner

## 2018-07-15 ENCOUNTER — Other Ambulatory Visit: Payer: Self-pay | Admitting: Nurse Practitioner

## 2018-07-17 ENCOUNTER — Other Ambulatory Visit: Payer: Self-pay | Admitting: Nurse Practitioner

## 2018-07-28 ENCOUNTER — Other Ambulatory Visit: Payer: Self-pay

## 2018-07-29 ENCOUNTER — Other Ambulatory Visit: Payer: Self-pay

## 2018-08-06 ENCOUNTER — Other Ambulatory Visit: Payer: Self-pay | Admitting: Nurse Practitioner

## 2018-08-15 ENCOUNTER — Other Ambulatory Visit: Payer: Self-pay | Admitting: Nurse Practitioner

## 2018-08-22 ENCOUNTER — Other Ambulatory Visit: Payer: Self-pay | Admitting: Nurse Practitioner

## 2018-09-24 ENCOUNTER — Other Ambulatory Visit: Payer: Self-pay | Admitting: Nurse Practitioner

## 2018-09-24 MED ORDER — METFORMIN HCL 500 MG PO TABS: 1000.0000 mg | ORAL_TABLET | Freq: Two times a day (BID) | ORAL | 1 refills | Status: DC

## 2018-10-01 ENCOUNTER — Encounter: Payer: Self-pay | Admitting: Nurse Practitioner

## 2018-10-01 ENCOUNTER — Encounter: Payer: Self-pay | Admitting: Certified Nurse Midwife

## 2018-10-01 ENCOUNTER — Ambulatory Visit: Payer: Federal, State, Local not specified - PPO | Admitting: Nurse Practitioner

## 2018-10-01 ENCOUNTER — Other Ambulatory Visit: Payer: Self-pay

## 2018-10-01 ENCOUNTER — Ambulatory Visit: Payer: Federal, State, Local not specified - PPO | Admitting: Certified Nurse Midwife

## 2018-10-01 VITALS — BP 124/78 | HR 70 | Resp 16 | Ht 64.25 in | Wt 224.0 lb

## 2018-10-01 VITALS — BP 160/82 | HR 94 | Temp 98.3°F | Ht 63.4 in | Wt 224.0 lb

## 2018-10-01 DIAGNOSIS — R7303 Prediabetes: Secondary | ICD-10-CM | POA: Diagnosis not present

## 2018-10-01 DIAGNOSIS — E782 Mixed hyperlipidemia: Secondary | ICD-10-CM

## 2018-10-01 DIAGNOSIS — I10 Essential (primary) hypertension: Secondary | ICD-10-CM | POA: Diagnosis not present

## 2018-10-01 DIAGNOSIS — N951 Menopausal and female climacteric states: Secondary | ICD-10-CM | POA: Diagnosis not present

## 2018-10-01 DIAGNOSIS — Z01419 Encounter for gynecological examination (general) (routine) without abnormal findings: Secondary | ICD-10-CM | POA: Diagnosis not present

## 2018-10-01 DIAGNOSIS — Z780 Asymptomatic menopausal state: Secondary | ICD-10-CM

## 2018-10-01 DIAGNOSIS — Z6839 Body mass index (BMI) 39.0-39.9, adult: Secondary | ICD-10-CM

## 2018-10-01 NOTE — Patient Instructions (Signed)

## 2018-10-01 NOTE — Progress Notes (Signed)
Subjective:     Patient ID: Shirley Kent , female    DOB: [birth year] , 62 y.o.   MRN: [id number]   Chief Complaint  Patient presents with  . Hypertension    HPI  Hypertension  This is a chronic problem. The current episode started more than 1 year ago. The problem has been gradually worsening since onset. The problem is uncontrolled. Pertinent negatives include no anxiety, blurred vision, chest pain, headaches, malaise/fatigue or palpitations. There are no associated agents to hypertension. Risk factors for coronary artery disease include diabetes mellitus, obesity and sedentary lifestyle. Past treatments include nothing. There are no compliance problems.  There is no history of kidney disease. There is no history of chronic renal disease.  Diabetes  She presents for her follow-up diabetic visit. She has type 2 diabetes mellitus. Pertinent negatives for hypoglycemia include no confusion, dizziness, headaches or nervousness/anxiousness. Pertinent negatives for diabetes include no blurred vision, no chest pain, no fatigue, no polydipsia, no polyphagia and no polyuria. Symptoms are stable. There are no diabetic complications. Risk factors for coronary artery disease include diabetes mellitus, hypertension and obesity. Current diabetic treatment includes oral agent (monotherapy). She is compliant with treatment all of the time. When asked about meal planning, she reported none. She has not had a previous visit with a dietitian. There is no change (does not check blood sugars) in her home blood glucose trend. An ACE inhibitor/angiotensin II receptor blocker is being taken. She does not see a podiatrist.Eye exam is current (normal per patient).     Past Medical History:  Diagnosis Date  . Fibroid   . Hyperlipidemia   . Hypertension    not on meds, working on diet and exercise  . OA (osteoarthritis) of knee    bilateral  . Vitamin D deficiency disease      Family History  Problem  Relation Age of Onset  . Heart failure Mother   . Kidney disease Mother   . Multiple sclerosis Sister 86  . Breast cancer Neg Hx      Current Outpatient Medications:  .  amLODipine (NORVASC) 5 MG tablet, TAKE 1 TABLET BY MOUTH ONCE DAILY, Disp: 90 tablet, Rfl: 0 .  hydrocortisone (ANUSOL-HC) 25 MG suppository, Place 1 suppository (25 mg total) rectally 2 (two) times daily. (Patient taking differently: Place 25 mg rectally 2 (two) times daily as needed. ), Disp: 24 suppository, Rfl: 3 .  metFORMIN (GLUCOPHAGE) 500 MG tablet, Take 2 tablets (1,000 mg total) by mouth 2 (two) times daily., Disp: 360 tablet, Rfl: 1 .  rosuvastatin (CRESTOR) 10 MG tablet, Take 1 tablet by mouth daily., Disp: , Rfl:  .  Vitamin D, Ergocalciferol, (DRISDOL) 50000 units CAPS capsule, Take 1 capsule (50,000 Units total) by mouth once a week. (Patient taking differently: Take 50,000 Units by mouth every 14 (fourteen) days. ), Disp: 30 capsule, Rfl: 3   No Known Allergies   Review of Systems  Constitutional: Negative.  Negative for fatigue and malaise/fatigue.  Eyes: Negative for blurred vision and visual disturbance.  Respiratory: Negative.   Cardiovascular: Negative.  Negative for chest pain, palpitations and leg swelling.  Gastrointestinal: Negative.   Endocrine: Negative.  Negative for polydipsia, polyphagia and polyuria.  Skin: Negative.   Neurological: Negative.  Negative for dizziness and headaches.  Psychiatric/Behavioral: Negative for confusion. The patient is not nervous/anxious.      Today's Vitals   10/01/18 0954  BP: (!) 160/82  Pulse: 94  Temp: 98.3 F (36.8 C)  TempSrc: Oral  SpO2: 96%  Weight: 224 lb (101.6 kg)  Height: 5' 3.4" (1.61 m)  PainSc: 0-No pain   Body mass index is 39.18 kg/m.   Objective:  Physical Exam Vitals signs reviewed.  Constitutional:      Appearance: She is well-developed.  Eyes:     Pupils: Pupils are equal, round, and reactive to light.  Neck:      Musculoskeletal: Normal range of motion and neck supple.  Cardiovascular:     Rate and Rhythm: Normal rate and regular rhythm.     Pulses: Normal pulses.     Heart sounds: Normal heart sounds. No murmur.  Pulmonary:     Effort: Pulmonary effort is normal.     Breath sounds: Normal breath sounds.  Chest:     Chest wall: No tenderness.  Musculoskeletal: Normal range of motion.  Skin:    General: Skin is warm and dry.     Capillary Refill: Capillary refill takes less than 2 seconds.  Neurological:     General: No focal deficit present.     Mental Status: She is alert and oriented to person, place, and time.     Cranial Nerves: No cranial nerve deficit.  Psychiatric:        Mood and Affect: Mood normal.        Assessment And Plan:     1. Essential hypertension . B/P is controlled.  . CMP ordered to check renal function.  . The importance of regular exercise and dietary modification was stressed to the patient. Stressed importance of losing ten percent of her body weight to help with B/P control. The weight loss would help with decreasing cardiac and cancer risk as well.   2. Mixed hyperlipidemia  Chronic, controlled  Continue with current medications  3. Prediabetes  Chronic, controlled  No current medications  Encouraged to limit intake of sugary foods and drinks  Encouraged to increase physical activity to 150 minutes per week  4. Class 2 severe obesity due to excess calories with serious comorbidity and body mass index (BMI) of 39.0 to 39.9 in adult [facility]) Chronic Discussed healthy diet and regular exercise options  Encouraged to exercise at least 150 minutes per week with 2 days of strength training Given handout for [email or web address] 10 tips, CDC exercise for adults and CDC Eat More Weight Less  [name], FNP

## 2018-10-01 NOTE — Patient Instructions (Addendum)
Obesity, Adult Obesity is having too much body fat. If you have a BMI of 30 or more, you are obese. BMI is a number that explains how much body fat you have. Obesity is often caused by taking in (consuming) more calories than your body uses. Obesity can cause serious health problems. Changing your lifestyle can help to treat obesity. Follow these instructions at home: Eating and drinking   Follow advice from your doctor about what to eat and drink. Your doctor may tell you to: ? Cut down on (limit) fast foods, sweets, and processed snack foods. ? Choose low-fat options. For example, choose low-fat milk instead of whole milk. ? Eat 5 or more servings of fruits or vegetables every day. ? Eat at home more often. This gives you more control over what you eat. ? Choose healthy foods when you eat out. ? Learn what a healthy portion size is. A portion size is the amount of a certain food that is healthy for you to eat at one time. This is different for each person. ? Keep low-fat snacks available. ? Avoid sugary drinks. These include soda, fruit juice, iced tea that is sweetened with sugar, and flavored milk. ? Eat a healthy breakfast.  Drink enough water to keep your pee (urine) clear or pale yellow.  Do not go without eating for long periods of time (do not fast).  Do not go on popular or trendy diets (fad diets). Physical Activity  Exercise often, as told by your doctor. Ask your doctor: ? What types of exercise are safe for you. ? How often you should exercise.  Warm up and stretch before being active.  Do slow stretching after being active (cool down).  Rest between times of being active. Lifestyle  Limit how much time you spend in front of your TV, computer, or video game system (be less sedentary).  Find ways to reward yourself that do not involve food.  Limit alcohol intake to no more than 1 drink a day for nonpregnant women and 2 drinks a day for men. One drink equals 12 oz  of beer, 5 oz of wine, or 1 oz of hard liquor. General instructions  Keep a weight loss journal. This can help you keep track of: ? The food that you eat. ? The exercise that you do.  Take over-the-counter and prescription medicines only as told by your doctor.  Take vitamins and supplements only as told by your doctor.  Think about joining a support group. Your doctor may be able to help with this.  Keep all follow-up visits as told by your doctor. This is important. Contact a doctor if:  You cannot meet your weight loss goal after you have changed your diet and lifestyle for 6 weeks. This information is not intended to replace advice given to you by your health care provider. Make sure you discuss any questions you have with your health care provider. Document Released: 12/23/2011 Document Revised: 03/07/2016 Document Reviewed: 07/19/2015 Elsevier Interactive Patient Education  2019 [employer]. Hypertension Hypertension, commonly called high blood pressure, is when the force of blood pumping through the arteries is too strong. The arteries are the blood vessels that carry blood from the heart throughout the body. Hypertension forces the heart to work harder to pump blood and may cause arteries to become narrow or stiff. Having untreated or uncontrolled hypertension can cause heart attacks, strokes, kidney disease, and other problems. A blood pressure reading consists of a higher number over  a lower number. Ideally, your blood pressure should be below 120/80. The first ("top") number is called the systolic pressure. It is a measure of the pressure in your arteries as your heart beats. The second ("bottom") number is called the diastolic pressure. It is a measure of the pressure in your arteries as the heart relaxes. What are the causes? The cause of this condition is not known. What increases the risk? Some risk factors for high blood pressure are under your control. Others are  not. Factors you can change  Smoking.  Having type 2 diabetes mellitus, high cholesterol, or both.  Not getting enough exercise or physical activity.  Being overweight.  Having too much fat, sugar, calories, or salt (sodium) in your diet.  Drinking too much alcohol. Factors that are difficult or impossible to change  Having chronic kidney disease.  Having a family history of high blood pressure.  Age. Risk increases with age.  Race. You may be at higher risk if you are African-American.  Gender. Men are at higher risk than women before age 50. After age 83, women are at higher risk than men.  Having obstructive sleep apnea.  Stress. What are the signs or symptoms? Extremely high blood pressure (hypertensive crisis) may cause:  Headache.  Anxiety.  Shortness of breath.  Nosebleed.  Nausea and vomiting.  Severe chest pain.  Jerky movements you cannot control (seizures). How is this diagnosed? This condition is diagnosed by measuring your blood pressure while you are seated, with your arm resting on a surface. The cuff of the blood pressure monitor will be placed directly against the skin of your upper arm at the level of your heart. It should be measured at least twice using the same arm. Certain conditions can cause a difference in blood pressure between your right and left arms. Certain factors can cause blood pressure readings to be lower or higher than normal (elevated) for a short period of time:  When your blood pressure is higher when you are in a health care provider's office than when you are at home, this is called white coat hypertension. Most people with this condition do not need medicines.  When your blood pressure is higher at home than when you are in a health care provider's office, this is called masked hypertension. Most people with this condition may need medicines to control blood pressure. If you have a high blood pressure reading during one  visit or you have normal blood pressure with other risk factors:  You may be asked to return on a different day to have your blood pressure checked again.  You may be asked to monitor your blood pressure at home for 1 week or longer. If you are diagnosed with hypertension, you may have other blood or imaging tests to help your health care provider understand your overall risk for other conditions. How is this treated? This condition is treated by making healthy lifestyle changes, such as eating healthy foods, exercising more, and reducing your alcohol intake. Your health care provider may prescribe medicine if lifestyle changes are not enough to get your blood pressure under control, and if:  Your systolic blood pressure is above 130.  Your diastolic blood pressure is above 80. Your personal target blood pressure may vary depending on your medical conditions, your age, and other factors. Follow these instructions at home: Eating and drinking   Eat a diet that is high in fiber and potassium, and low in sodium, added sugar, and  fat. An example eating plan is called the DASH (Dietary Approaches to Stop Hypertension) diet. To eat this way: ? Eat plenty of fresh fruits and vegetables. Try to fill half of your plate at each meal with fruits and vegetables. ? Eat whole grains, such as whole wheat pasta, brown rice, or whole grain bread. Fill about one quarter of your plate with whole grains. ? Eat or drink low-fat dairy products, such as skim milk or low-fat yogurt. ? Avoid fatty cuts of meat, processed or cured meats, and poultry with skin. Fill about one quarter of your plate with lean proteins, such as fish, chicken without skin, beans, eggs, and tofu. ? Avoid premade and processed foods. These tend to be higher in sodium, added sugar, and fat.  Reduce your daily sodium intake. Most people with hypertension should eat less than 1,500 mg of sodium a day.  Limit alcohol intake to no more than 1  drink a day for nonpregnant women and 2 drinks a day for men. One drink equals 12 oz of beer, 5 oz of wine, or 1 oz of hard liquor. Lifestyle   Work with your health care provider to maintain a healthy body weight or to lose weight. Ask what an ideal weight is for you.  Get at least 30 minutes of exercise that causes your heart to beat faster (aerobic exercise) most days of the week. Activities may include walking, swimming, or biking.  Include exercise to strengthen your muscles (resistance exercise), such as pilates or lifting weights, as part of your weekly exercise routine. Try to do these types of exercises for 30 minutes at least 3 days a week.  Do not use any products that contain nicotine or tobacco, such as cigarettes and e-cigarettes. If you need help quitting, ask your health care provider.  Monitor your blood pressure at home as told by your health care provider.  Keep all follow-up visits as told by your health care provider. This is important. Medicines  Take over-the-counter and prescription medicines only as told by your health care provider. Follow directions carefully. Blood pressure medicines must be taken as prescribed.  Do not skip doses of blood pressure medicine. Doing this puts you at risk for problems and can make the medicine less effective.  Ask your health care provider about side effects or reactions to medicines that you should watch for. Contact a health care provider if:  You think you are having a reaction to a medicine you are taking.  You have headaches that keep coming back (recurring).  You feel dizzy.  You have swelling in your ankles.  You have trouble with your vision. Get help right away if:  You develop a severe headache or confusion.  You have unusual weakness or numbness.  You feel faint.  You have severe pain in your chest or abdomen.  You vomit repeatedly.  You have trouble breathing. Summary  Hypertension is when the  force of blood pumping through your arteries is too strong. If this condition is not controlled, it may put you at risk for serious complications.  Your personal target blood pressure may vary depending on your medical conditions, your age, and other factors. For most people, a normal blood pressure is less than 120/80.  Hypertension is treated with lifestyle changes, medicines, or a combination of both. Lifestyle changes include weight loss, eating a healthy, low-sodium diet, exercising more, and limiting alcohol. This information is not intended to replace advice given to you by your  health care provider. Make sure you discuss any questions you have with your health care provider. Document Released: 09/30/2005 Document Revised: 08/28/2016 Document Reviewed: 08/28/2016 Elsevier Interactive Patient Education  2019 [employer].

## 2018-10-01 NOTE — Progress Notes (Signed)
62 y.o. G1P0010 Married  African American Fe here for annual exam. Menopausal no symptoms now. Denies vaginal bleeding or vaginal dryness.  Sees Dr. Laurance Flatten for glucose control, hypertension,cholesterol and Vitamin D management every 4 months. BP better here, than PCP( had to wait). No health changes over the past year. Has not had BMD as of yet. Working on increase exercise to help with weight change. No health problems today.  Patient's last menstrual period was 03/26/2004 (approximate).          Sexually active: No.  The current method of family planning is status post hysterectomy.    Exercising: Yes.    occ Smoker:  no  Review of Systems  Constitutional: Negative.   HENT: Negative.   Eyes: Negative.   Respiratory: Negative.   Cardiovascular: Negative.   Gastrointestinal: Negative.   Genitourinary: Negative.   Musculoskeletal: Negative.   Skin: Negative.   Neurological: Negative.   Endo/Heme/Allergies: Negative.   Psychiatric/Behavioral: Negative.     Health Maintenance: Pap:  2005 neg History of Abnormal Pap: no MMG:  06-10-18 category a density birads 1:neg Self Breast exams: yes Colonoscopy:  2018 f/u 46yrs polyp removed BMD:   none TDaP:  2011 Shingles: not done Pneumonia: not done Hep C and HIV: both neg 2016 Labs: if  needed   reports that she has never smoked. She has never used smokeless tobacco. She reports that she does not drink alcohol or use drugs.  Past Medical History:  Diagnosis Date  . Fibroid   . Hyperlipidemia   . Hypertension    not on meds, working on diet and exercise  . OA (osteoarthritis) of knee    bilateral  . Vitamin D deficiency disease     Past Surgical History:  Procedure Laterality Date  . ABDOMINAL HYSTERECTOMY  04/09/04   fibroids, ovaries remain  . BREAST BIOPSY    . BREAST REDUCTION SURGERY Bilateral 2000  . BREAST SURGERY Left 1972   biopsy benign  . CHOLECYSTECTOMY, LAPAROSCOPIC  04/21/11  . REDUCTION MAMMAPLASTY Bilateral    . TONSILLECTOMY  age 41    Current Outpatient Medications  Medication Sig Dispense Refill  . amLODipine (NORVASC) 5 MG tablet TAKE 1 TABLET BY MOUTH ONCE DAILY 90 tablet 0  . hydrocortisone (ANUSOL-HC) 25 MG suppository Place 1 suppository (25 mg total) rectally 2 (two) times daily. (Patient taking differently: Place 25 mg rectally 2 (two) times daily as needed. ) 24 suppository 3  . metFORMIN (GLUCOPHAGE) 500 MG tablet Take 2 tablets (1,000 mg total) by mouth 2 (two) times daily. 360 tablet 1  . rosuvastatin (CRESTOR) 10 MG tablet Take 1 tablet by mouth daily.    . Vitamin D, Ergocalciferol, (DRISDOL) 50000 units CAPS capsule Take 1 capsule (50,000 Units total) by mouth once a week. (Patient taking differently: Take 50,000 Units by mouth every 30 (thirty) days. ) 30 capsule 3   No current facility-administered medications for this visit.     Family History  Problem Relation Age of Onset  . Heart failure Mother   . Kidney disease Mother   . Multiple sclerosis Sister 39  . Breast cancer Neg Hx     ROS:  Pertinent items are noted in HPI.  Otherwise, a comprehensive ROS was negative.  Exam:   BP 124/78   Pulse 70   Resp 16   Ht 5' 4.25" (1.632 m)   Wt 224 lb (101.6 kg)   LMP 03/26/2004 (Approximate)   BMI 38.15 kg/m  Height: 5'  4.25" (163.2 cm) Ht Readings from Last 3 Encounters:  10/01/18 5' 4.25" (1.632 m)  10/01/18 5' 3.4" (1.61 m)  10/01/17 5\' 4"  (1.626 m)    General appearance: alert, cooperative and appears stated age Head: Normocephalic, without obvious abnormality, atraumatic Neck: no adenopathy, supple, symmetrical, trachea midline and thyroid normal to inspection and palpation Lungs: clear to auscultation bilaterally Breasts: normal appearance, no masses or tenderness, No nipple retraction or dimpling, No nipple discharge or bleeding, No axillary or supraclavicular adenopathy, pendulous bilateral with reduction scarring  Heart: regular rate and rhythm Abdomen:  soft, non-tender; no masses,  no organomegaly Extremities: extremities normal, atraumatic, no cyanosis or edema Skin: Skin color, texture, turgor normal. No rashes or lesions Lymph nodes: Cervical, supraclavicular, and axillary nodes normal. No abnormal inguinal nodes palpated Neurologic: Grossly normal   Pelvic: External genitalia:  no lesions              Urethra:  normal appearing urethra with no masses, tenderness or lesions              Bartholin's and Skene's: normal                 Vagina: normal appearing vagina with normal color and discharge, no lesions              Cervix: absent              Pap taken: No. Bimanual Exam:  Uterus:  uterus absent              Adnexa: normal adnexa and no mass, fullness, tenderness               Rectovaginal: Confirms               Anus:  normal sphincter tone, no lesions  Chaperone present: yes  A:  Well Woman with normal exam  Menopausal no HRT  Hypertension, glucose, cholesterol management with PCP, all stable per patient  Overweight working on change  BMD due  P:   Reviewed health and wellness pertinent to exam  Aware if vaginal dryness coconut oil use is acceptable, has used  Continue follow up with PCP as indicated  Encouraged to continue working on weight and exercise  Discussed BMD assessment and recommendations. Patient agreeable to scheduling. Order placed with Breast center and patient will call to schedule.  Pap smear: no   counseled on breast self exam, feminine hygiene, adequate intake of calcium and vitamin D, diet and exercise, Kegel's exercises  return annually or prn  An After Visit Summary was printed and given to the patient.

## 2018-10-02 LAB — CMP14 + ANION GAP
A/G RATIO: 1.8 (ref 1.2–2.2)
ALT: 14 IU/L (ref 0–32)
AST: 11 IU/L (ref 0–40)
Albumin: 4.6 g/dL (ref 3.6–4.8)
Alkaline Phosphatase: 77 IU/L (ref 39–117)
Anion Gap: 15 mmol/L (ref 10.0–18.0)
BILIRUBIN TOTAL: 0.3 mg/dL (ref 0.0–1.2)
BUN/Creatinine Ratio: 13 (ref 12–28)
BUN: 11 mg/dL (ref 8–27)
CALCIUM: 9.7 mg/dL (ref 8.7–10.3)
CHLORIDE: 104 mmol/L (ref 96–106)
CO2: 24 mmol/L (ref 20–29)
Creatinine, Ser: 0.86 mg/dL (ref 0.57–1.00)
GFR, EST AFRICAN AMERICAN: 84 mL/min/{1.73_m2} (ref >59)
GFR, EST NON AFRICAN AMERICAN: 73 mL/min/{1.73_m2} (ref >59)
GLOBULIN, TOTAL: 2.5 g/dL (ref 1.5–4.5)
GLUCOSE: 105 mg/dL — AB (ref 65–99)
POTASSIUM: 4.2 mmol/L (ref 3.5–5.2)
SODIUM: 143 mmol/L (ref 134–144)
TOTAL PROTEIN: 7.1 g/dL (ref 6.0–8.5)

## 2018-10-02 LAB — LIPID PANEL
CHOL/HDL RATIO: 4 ratio (ref 0.0–4.4)
Cholesterol, Total: 194 mg/dL (ref 100–199)
HDL: 49 mg/dL (ref >39)
LDL Calculated: 117 mg/dL — ABNORMAL HIGH (ref 0–99)
Triglycerides: 138 mg/dL (ref 0–149)
VLDL Cholesterol Cal: 28 mg/dL (ref 5–40)

## 2018-10-02 LAB — HEMOGLOBIN A1C
ESTIMATED AVERAGE GLUCOSE: 123 mg/dL
Hgb A1c MFr Bld: 5.9 % — ABNORMAL HIGH (ref 4.8–5.6)

## 2018-10-20 ENCOUNTER — Other Ambulatory Visit: Payer: Self-pay | Admitting: Certified Nurse Midwife

## 2018-10-20 MED ORDER — HYDROCORTISONE ACETATE 25 MG RE SUPP: 25.0000 mg | Freq: Two times a day (BID) | RECTAL | 2 refills | Status: DC

## 2018-10-20 NOTE — Telephone Encounter (Signed)
Medication refill request: Hydrocortison 25 mg  Last AEX:  10/01/18 Next AEX:  10/20/19 Last MMG (if hormonal medication request): Bi-rads 1neg  Medication requested: #24 with 3 RF

## 2018-10-20 NOTE — Telephone Encounter (Signed)
Patient would like a refill on hydrocortisone called in to [facility] at [address] in [place].

## 2018-10-26 ENCOUNTER — Telehealth: Payer: Self-pay | Admitting: Certified Nurse Midwife

## 2018-10-26 MED ORDER — HYDROCORTISONE ACETATE 25 MG RE SUPP: 25.0000 mg | Freq: Two times a day (BID) | RECTAL | 2 refills | Status: DC

## 2018-10-26 NOTE — Telephone Encounter (Signed)
Patient says her prescription for hydrocortisone suppositories was sent to [facility] for a 12 day supply and should be for 22 days.

## 2018-10-26 NOTE — Telephone Encounter (Signed)
Rx for Hydrocortisone suppositories place 1 suppository rectally 2 times daily #24 2RF sent to pharmacy on file by Melvia Heaps CNM. Patient states this should have been for a 22 day supply but was sent as a 12 day supply. Routing to [employer] CNM for review and advise.

## 2018-10-26 NOTE — Telephone Encounter (Signed)
Spoke with patient. Patient states that mail order will need more than a 12 day supply to fill her rx. [facility] [address] so that she can pick up the rx earlier. Rx for Hydrocortisone suppositories place 1 suppository rectally 2 times daily #24 2RF [facility] [address] [facility] patient.

## 2018-10-26 NOTE — Telephone Encounter (Signed)
Patient called and said the [facility] is[facility] [address] [place].

## 2018-11-15 ENCOUNTER — Other Ambulatory Visit: Payer: Self-pay | Admitting: Nurse Practitioner

## 2018-12-29 ENCOUNTER — Encounter: Payer: Self-pay | Admitting: Nurse Practitioner

## 2018-12-29 ENCOUNTER — Ambulatory Visit: Payer: Federal, State, Local not specified - PPO | Admitting: Nurse Practitioner

## 2018-12-29 ENCOUNTER — Other Ambulatory Visit: Payer: Self-pay

## 2018-12-29 VITALS — BP 142/80 | HR 93 | Temp 98.2°F | Ht 63.4 in | Wt 215.0 lb

## 2018-12-29 DIAGNOSIS — E782 Mixed hyperlipidemia: Secondary | ICD-10-CM | POA: Diagnosis not present

## 2018-12-29 DIAGNOSIS — R7303 Prediabetes: Secondary | ICD-10-CM | POA: Diagnosis not present

## 2018-12-29 DIAGNOSIS — I10 Essential (primary) hypertension: Secondary | ICD-10-CM

## 2018-12-29 NOTE — Progress Notes (Signed)
Subjective:     Patient ID: Shirley Kent , female    DOB: November 26, 1955 , 63 y.o.   MRN: [id number]   Chief Complaint  Patient presents with  . Hypertension    HPI  Hypertension  This is a chronic problem. The current episode started more than 1 year ago. The problem is unchanged. The problem is uncontrolled. Pertinent negatives include no anxiety, blurred vision or peripheral edema. Risk factors for coronary artery disease include sedentary lifestyle and obesity. Past treatments include calcium channel blockers. There is no history of angina. There is no history of chronic renal disease.  Diabetes  She presents for her follow-up diabetic visit. Diabetes type: prediabetes. Pertinent negatives for hypoglycemia include no confusion, dizziness or nervousness/anxiousness. Pertinent negatives for diabetes include no blurred vision. Symptoms are stable. Risk factors for coronary artery disease include obesity and hypertension. She is compliant with treatment all of the time. She is following a generally healthy diet. She has not had a previous visit with a dietitian. She participates in exercise every other day. There is no change in her home blood glucose trend. An ACE inhibitor/angiotensin II receptor blocker is not being taken. Eye exam is current (June 2019).     Past Medical History:  Diagnosis Date  . Fibroid   . Hyperlipidemia   . Hypertension    not on meds, working on diet and exercise  . OA (osteoarthritis) of knee    bilateral  . Vitamin D deficiency disease      Family History  Problem Relation Age of Onset  . Heart failure Mother   . Kidney disease Mother   . Multiple sclerosis Sister 71  . Breast cancer Neg Hx      Current Outpatient Medications:  .  amLODipine (NORVASC) 5 MG tablet, TAKE 1 TABLET BY MOUTH EVERY DAY, Disp: 90 tablet, Rfl: 0 .  hydrocortisone (ANUSOL-HC) 25 MG suppository, Place 1 suppository (25 mg total) rectally 2 (two) times daily., Disp: 24  suppository, Rfl: 2 .  metFORMIN (GLUCOPHAGE) 500 MG tablet, Take 2 tablets (1,000 mg total) by mouth 2 (two) times daily., Disp: 360 tablet, Rfl: 1 .  rosuvastatin (CRESTOR) 10 MG tablet, Take 1 tablet by mouth daily., Disp: , Rfl:    No Known Allergies   Review of Systems  Eyes: Negative for blurred vision.  Neurological: Negative for dizziness.  Psychiatric/Behavioral: Negative for confusion. The patient is not nervous/anxious.      Today's Vitals   12/29/18 0938  BP: (!) 146/90  Pulse: 93  Temp: 98.2 F (36.8 C)  TempSrc: Oral  Weight: 215 lb (97.5 kg)  Height: 5' 3.4" (1.61 m)   Body mass index is 37.61 kg/m.   Objective:  Physical Exam Vitals signs reviewed.  Constitutional:      Appearance: Normal appearance. She is well-developed. She is obese.  Eyes:     Pupils: Pupils are equal, round, and reactive to light.  Neck:     Musculoskeletal: Normal range of motion and neck supple.  Cardiovascular:     Rate and Rhythm: Normal rate and regular rhythm.     Pulses: Normal pulses.     Heart sounds: Normal heart sounds. No murmur.  Pulmonary:     Effort: Pulmonary effort is normal.     Breath sounds: Normal breath sounds.  Chest:     Chest wall: No tenderness.  Musculoskeletal: Normal range of motion.  Skin:    General: Skin is warm and dry.  Capillary Refill: Capillary refill takes less than 2 seconds.  Neurological:     General: No focal deficit present.     Mental Status: She is alert and oriented to person, place, and time.     Cranial Nerves: No cranial nerve deficit.  Psychiatric:        Mood and Affect: Mood normal.         Assessment And Plan:    1. Essential hypertension . B/P is controlled.  . The importance of regular exercise and dietary modification was stressed to the patient.  . Stressed importance of losing ten percent of her body weight to help with B/P control.  . The weight loss would help with decreasing cardiac and cancer risk as  well.   2. Mixed hyperlipidemia  Chronic, controlled  Continue with current medications  3. Prediabetes  Chronic, fair control  Continue with current medications  Encouraged to limit intake of sugary foods and drinks  Encouraged to increase physical activity to 150 minutes per week    [name], FNP

## 2018-12-31 ENCOUNTER — Ambulatory Visit: Payer: Federal, State, Local not specified - PPO | Admitting: Nurse Practitioner

## 2019-01-15 ENCOUNTER — Encounter: Payer: Self-pay | Admitting: Nurse Practitioner

## 2019-02-08 ENCOUNTER — Other Ambulatory Visit: Payer: Self-pay | Admitting: Nurse Practitioner

## 2019-03-17 ENCOUNTER — Other Ambulatory Visit: Payer: Self-pay | Admitting: Nurse Practitioner

## 2019-04-07 ENCOUNTER — Telehealth: Payer: Self-pay | Admitting: Nurse Practitioner

## 2019-04-07 ENCOUNTER — Other Ambulatory Visit: Payer: Self-pay

## 2019-04-07 NOTE — Telephone Encounter (Signed)
CALLED PT TO DO COVID PRE-SCREEN FOR 6/25 APPT NO EXPOSURE/SYMPTOMS/TRAVEL °

## 2019-04-08 ENCOUNTER — Encounter: Payer: Self-pay | Admitting: Nurse Practitioner

## 2019-04-08 ENCOUNTER — Ambulatory Visit: Payer: Federal, State, Local not specified - PPO | Admitting: Nurse Practitioner

## 2019-04-08 ENCOUNTER — Encounter: Payer: Federal, State, Local not specified - PPO | Admitting: Nurse Practitioner

## 2019-04-08 VITALS — BP 158/88 | HR 95 | Temp 98.2°F | Ht 63.2 in | Wt 209.2 lb

## 2019-04-08 DIAGNOSIS — I10 Essential (primary) hypertension: Secondary | ICD-10-CM

## 2019-04-08 DIAGNOSIS — Z0000 Encounter for general adult medical examination without abnormal findings: Secondary | ICD-10-CM

## 2019-04-08 DIAGNOSIS — G4700 Insomnia, unspecified: Secondary | ICD-10-CM

## 2019-04-08 DIAGNOSIS — R7303 Prediabetes: Secondary | ICD-10-CM

## 2019-04-08 DIAGNOSIS — E559 Vitamin D deficiency, unspecified: Secondary | ICD-10-CM

## 2019-04-08 LAB — POCT URINALYSIS DIPSTICK
Bilirubin, UA: NEGATIVE
Glucose, UA: NEGATIVE
Ketones, UA: NEGATIVE
Leukocytes, UA: NEGATIVE
Nitrite, UA: NEGATIVE
Protein, UA: NEGATIVE
Spec Grav, UA: 1.025 (ref 1.010–1.025)
Urobilinogen, UA: 0.2 E.U./dL
pH, UA: 5.5 (ref 5.0–8.0)

## 2019-04-08 LAB — POCT UA - MICROALBUMIN
Albumin/Creatinine Ratio, Urine, POC: <30
Creatinine, POC: 300 mg/dL
Microalbumin Ur, POC: 10 mg/L

## 2019-04-08 NOTE — Progress Notes (Addendum)
Subjective:     Patient ID: Shirley Kent , female    DOB: Apr 20, 1956 , 63 y.o.   MRN: [id number]   Chief Complaint  Patient presents with  . Annual Exam   The patient states she uses post menopausal status for birth control. Last LMP was Patient's last menstrual period was 03/26/2004 (approximate)..Negative for Dysmenorrhea and Negative for Menorrhagia Mammogram last done 06/10/2018 Negative for: breast discharge, breast lump(s), breast pain and breast self exam.  Pertinent negatives include abnormal bleeding (hematology), anxiety, decreased libido, depression, difficulty falling sleep, dyspareunia, history of infertility, nocturia, sexual dysfunction, sleep disturbances, urinary incontinence, urinary urgency, vaginal discharge and vaginal itching. Diet regular. The patient states her exercise level is  walking daily - 1 -1.5 hours.      The patient's tobacco use is:  Social History   Tobacco Use  Smoking Status Never Smoker  Smokeless Tobacco Never Used   She has been exposed to passive smoke. The patient's alcohol use is:  Social History   Substance and Sexual Activity  Alcohol Use No   Additional information: Last pap December 2019 at [facility] next one scheduled for (hysterectomy) HPI  Here for HM   Hypertension This is a chronic problem. The current episode started more than 1 year ago. Pertinent negatives include no anxiety or malaise/fatigue. There are no associated agents to hypertension. Past treatments include nothing. There are no compliance problems.  There is no history of chronic renal disease.     Past Medical History:  Diagnosis Date  . Fibroid   . Hyperlipidemia   . Hypertension    not on meds, working on diet and exercise  . OA (osteoarthritis) of knee    bilateral  . Vitamin D deficiency disease      Family History  Problem Relation Age of Onset  . Heart failure Mother   . Kidney disease Mother   . Multiple sclerosis Sister 36   . Breast cancer Neg Hx      Current Outpatient Medications:  .  amLODipine (NORVASC) 5 MG tablet, TAKE 1 TABLET BY MOUTH EVERY DAY, Disp: 90 tablet, Rfl: 0 .  metFORMIN (GLUCOPHAGE) 500 MG tablet, TAKE 2 TABLETS BY MOUTH TWICE A DAY, Disp: 360 tablet, Rfl: 1 .  rosuvastatin (CRESTOR) 10 MG tablet, Take 1 tablet by mouth daily., Disp: , Rfl:  .  hydrocortisone (ANUSOL-HC) 25 MG suppository, Place 1 suppository (25 mg total) rectally 2 (two) times daily. (Patient not taking: Reported on 04/08/2019), Disp: 24 suppository, Rfl: 2   No Known Allergies   Review of Systems  Constitutional: Negative.  Negative for malaise/fatigue.  HENT: Negative.   Eyes: Negative.   Respiratory: Negative.   Cardiovascular: Negative.   Gastrointestinal: Negative.   Endocrine: Negative.   Genitourinary: Negative.   Musculoskeletal: Negative.   Skin: Negative.   Allergic/Immunologic: Negative.   Neurological: Negative.   Hematological: Negative.   Psychiatric/Behavioral: Negative.      Today's Vitals   04/08/19 0909  BP: (!) 158/88  Pulse: 95  Temp: 98.2 F (36.8 C)  TempSrc: Oral  Weight: 209 lb 3.2 oz (94.9 kg)  Height: 5' 3.2" (1.605 m)  PainSc: 0-No pain   Body mass index is 36.82 kg/m.   Objective:  Physical Exam Vitals signs reviewed.  Constitutional:      Appearance: Normal appearance. She is well-developed. She is obese.  HENT:     Head: Normocephalic and atraumatic.     Right Ear: Hearing, tympanic membrane, ear  canal and external ear normal.     Left Ear: Hearing, tympanic membrane, ear canal and external ear normal.  Eyes:     General: Lids are normal.     Extraocular Movements: Extraocular movements intact.     Conjunctiva/sclera: Conjunctivae normal.     Pupils: Pupils are equal, round, and reactive to light.     Funduscopic exam:    Right eye: No papilledema.        Left eye: No papilledema.  Neck:     Musculoskeletal: Full passive range of motion without pain, normal  range of motion and neck supple.     Thyroid: No thyroid mass.     Vascular: No carotid bruit.  Cardiovascular:     Rate and Rhythm: Normal rate and regular rhythm.     Pulses: Normal pulses.     Heart sounds: Normal heart sounds. No murmur.  Pulmonary:     Effort: Pulmonary effort is normal.     Breath sounds: Normal breath sounds.  Abdominal:     General: Abdomen is flat. Bowel sounds are normal.     Palpations: Abdomen is soft.  Musculoskeletal: Normal range of motion.        General: No swelling.     Right lower leg: No edema.     Left lower leg: No edema.  Skin:    General: Skin is warm and dry.     Capillary Refill: Capillary refill takes less than 2 seconds.  Neurological:     General: No focal deficit present.     Mental Status: She is alert and oriented to person, place, and time.     Cranial Nerves: No cranial nerve deficit.     Sensory: No sensory deficit.  Psychiatric:        Mood and Affect: Mood normal.        Behavior: Behavior normal.        Thought Content: Thought content normal.        Judgment: Judgment normal.         Assessment And Plan:     1. Essential hypertension . B/P is poorly controlled.  . CMP ordered to check renal function.  . Blood pressure is elevated today, we may need to increase her dose of amlodipine . Avoid high salt foods.  . The importance of regular exercise and dietary modification was stressed to the patient.  . Stressed importance of losing ten percent of her body weight to help with B/P control.  . The weight loss would help with decreasing cardiac and cancer risk as well.  . EKG done with NSR HR 81 - EKG 12-Lead - POCT UA - Microalbumin - POCT Urinalysis Dipstick (81002)  2. Encounter for general adult medical examination w/o abnormal findings . Behavior modifications discussed and diet history reviewed.   . Pt will continue to exercise regularly and modify diet with low GI, plant based foods and decrease intake of  processed foods.  . Recommend intake of daily multivitamin, Vitamin D, and calcium.  . Recommend for preventive screenings, as well as recommend immunizations that include influenza, TDAP        [name], FNP    THE PATIENT IS ENCOURAGED TO PRACTICE SOCIAL DISTANCING DUE TO THE COVID-19 PANDEMIC.

## 2019-04-09 LAB — CMP14 + ANION GAP
ALT: 11 IU/L (ref 0–32)
AST: 15 IU/L (ref 0–40)
Albumin/Globulin Ratio: 2 (ref 1.2–2.2)
Albumin: 4.7 g/dL (ref 3.8–4.8)
Alkaline Phosphatase: 78 IU/L (ref 39–117)
Anion Gap: 17 mmol/L (ref 10.0–18.0)
BUN/Creatinine Ratio: 14 (ref 12–28)
BUN: 13 mg/dL (ref 8–27)
Bilirubin Total: 0.5 mg/dL (ref 0.0–1.2)
CO2: 25 mmol/L (ref 20–29)
Calcium: 10 mg/dL (ref 8.7–10.3)
Chloride: 102 mmol/L (ref 96–106)
Creatinine, Ser: 0.93 mg/dL (ref 0.57–1.00)
GFR calc Af Amer: 76 mL/min/{1.73_m2} (ref >59)
GFR calc non Af Amer: 66 mL/min/{1.73_m2} (ref >59)
Globulin, Total: 2.4 g/dL (ref 1.5–4.5)
Glucose: 105 mg/dL — ABNORMAL HIGH (ref 65–99)
Potassium: 4 mmol/L (ref 3.5–5.2)
Sodium: 144 mmol/L (ref 134–144)
Total Protein: 7.1 g/dL (ref 6.0–8.5)

## 2019-04-09 LAB — LIPID PANEL
Chol/HDL Ratio: 4 ratio (ref 0.0–4.4)
Cholesterol, Total: 182 mg/dL (ref 100–199)
HDL: 45 mg/dL (ref >39)
LDL Calculated: 107 mg/dL — ABNORMAL HIGH (ref 0–99)
Triglycerides: 148 mg/dL (ref 0–149)
VLDL Cholesterol Cal: 30 mg/dL (ref 5–40)

## 2019-04-09 LAB — CBC
Hematocrit: 36.7 % (ref 34.0–46.6)
Hemoglobin: 12.3 g/dL (ref 11.1–15.9)
MCH: 29.2 pg (ref 26.6–33.0)
MCHC: 33.5 g/dL (ref 31.5–35.7)
MCV: 87 fL (ref 79–97)
Platelets: 312 10*3/uL (ref 150–450)
RBC: 4.21 x10E6/uL (ref 3.77–5.28)
RDW: 12.7 % (ref 11.7–15.4)
WBC: 7.2 10*3/uL (ref 3.4–10.8)

## 2019-04-09 LAB — VITAMIN D 25 HYDROXY (VIT D DEFICIENCY, FRACTURES): Vit D, 25-Hydroxy: 46.4 ng/mL (ref 30.0–100.0)

## 2019-04-09 LAB — HEMOGLOBIN A1C
Est. average glucose Bld gHb Est-mCnc: 114 mg/dL
Hgb A1c MFr Bld: 5.6 % (ref 4.8–5.6)

## 2019-05-03 ENCOUNTER — Other Ambulatory Visit: Payer: Self-pay | Admitting: Nurse Practitioner

## 2019-06-15 ENCOUNTER — Telehealth: Payer: Self-pay

## 2019-06-15 ENCOUNTER — Other Ambulatory Visit: Payer: Self-pay

## 2019-06-15 MED ORDER — ROSUVASTATIN CALCIUM 10 MG PO TABS: 10.0000 mg | ORAL_TABLET | Freq: Every day | ORAL | 0 refills | Status: DC

## 2019-06-15 NOTE — Telephone Encounter (Signed)
I called patient because her ins company stated she may not be taken the rosuvastatin as directed pt stated she is taking it and would like a refill to be sent to [facility]. I have sent over the refill. YRL,RMA

## 2019-07-19 ENCOUNTER — Encounter: Payer: Self-pay | Admitting: Nurse Practitioner

## 2019-07-19 ENCOUNTER — Other Ambulatory Visit: Payer: Self-pay | Admitting: Nurse Practitioner

## 2019-07-19 DIAGNOSIS — Z1231 Encounter for screening mammogram for malignant neoplasm of breast: Secondary | ICD-10-CM

## 2019-08-02 ENCOUNTER — Other Ambulatory Visit: Payer: Self-pay | Admitting: Nurse Practitioner

## 2019-08-24 ENCOUNTER — Other Ambulatory Visit: Payer: Self-pay

## 2019-08-24 ENCOUNTER — Ambulatory Visit: Payer: Federal, State, Local not specified - PPO | Admitting: Nurse Practitioner

## 2019-08-24 ENCOUNTER — Encounter: Payer: Self-pay | Admitting: Nurse Practitioner

## 2019-08-24 VITALS — BP 158/86 | HR 86 | Temp 98.7°F | Ht 64.6 in | Wt 211.4 lb

## 2019-08-24 DIAGNOSIS — I10 Essential (primary) hypertension: Secondary | ICD-10-CM

## 2019-08-24 DIAGNOSIS — R7303 Prediabetes: Secondary | ICD-10-CM | POA: Diagnosis not present

## 2019-08-24 DIAGNOSIS — E782 Mixed hyperlipidemia: Secondary | ICD-10-CM | POA: Diagnosis not present

## 2019-08-24 DIAGNOSIS — J302 Other seasonal allergic rhinitis: Secondary | ICD-10-CM

## 2019-08-24 DIAGNOSIS — E669 Obesity, unspecified: Secondary | ICD-10-CM | POA: Diagnosis not present

## 2019-08-24 MED ORDER — FLUTICASONE PROPIONATE 50 MCG/ACT NA SUSP: 1.0000 | Freq: Every day | NASAL | 2 refills | Status: DC

## 2019-08-24 NOTE — Patient Instructions (Signed)
   HBP [place] brand medications for allergy symptoms or cold symptoms  Avoid aleve and ibuprofen due to increased blood pressure

## 2019-08-24 NOTE — Progress Notes (Signed)
Subjective:     Patient ID: Shirley Kent , female    DOB: August 12, 1956 , 63 y.o.   MRN: [id number]   Chief Complaint  Patient presents with  . Hypertension    HPI  She currently does not have a blood pressure cuff. She is able to get a free one from her insurance company. She is now retired.  Wt Readings from Last 3 Encounters: 08/24/19 : 211 lb 6.4 oz (95.9 kg) 04/08/19 : 209 lb 3.2 oz (94.9 kg) 12/29/18 : 215 lb (97.5 kg)   Hypertension This is a chronic problem. The current episode started more than 1 year ago. The problem is unchanged. The problem is uncontrolled. Pertinent negatives include no anxiety, blurred vision, chest pain, headaches, palpitations or shortness of breath. There are no associated agents to hypertension. Risk factors for coronary artery disease include obesity and sedentary lifestyle. Past treatments include calcium channel blockers. The current treatment provides mild improvement. There are no compliance problems.  There is no history of angina. There is no history of chronic renal disease.     Past Medical History:  Diagnosis Date  . Fibroid   . Hyperlipidemia   . Hypertension    not on meds, working on diet and exercise  . OA (osteoarthritis) of knee    bilateral  . Vitamin D deficiency disease      Family History  Problem Relation Age of Onset  . Heart failure Mother   . Kidney disease Mother   . Multiple sclerosis Sister 10  . Breast cancer Neg Hx      Current Outpatient Medications:  .  amLODipine (NORVASC) 5 MG tablet, TAKE 1 TABLET BY MOUTH EVERY DAY, Disp: 90 tablet, Rfl: 0 .  hydrocortisone (ANUSOL-HC) 25 MG suppository, Place 1 suppository (25 mg total) rectally 2 (two) times daily., Disp: 24 suppository, Rfl: 2 .  metFORMIN (GLUCOPHAGE) 500 MG tablet, TAKE 2 TABLETS BY MOUTH TWICE A DAY, Disp: 360 tablet, Rfl: 1 .  rosuvastatin (CRESTOR) 10 MG tablet, Take 1 tablet (10 mg total) by mouth daily., Disp: 90 tablet, Rfl: 0   No  Known Allergies   Review of Systems  Constitutional: Negative.   Eyes: Negative for blurred vision.  Respiratory: Negative for shortness of breath.   Cardiovascular: Negative for chest pain, palpitations and leg swelling.  Musculoskeletal: Negative.   Neurological: Negative for dizziness and headaches.  Psychiatric/Behavioral: Negative.      Today's Vitals   08/24/19 0929  BP: (!) 158/86  Pulse: 86  Temp: 98.7 F (37.1 C)  TempSrc: Oral  Weight: 211 lb 6.4 oz (95.9 kg)  Height: 5' 4.6" (1.641 m)   Body mass index is 35.62 kg/m.   Objective:  Physical Exam Constitutional:      Appearance: Normal appearance.  Cardiovascular:     Rate and Rhythm: Normal rate and regular rhythm.     Pulses: Normal pulses.     Heart sounds: Normal heart sounds. No murmur.  Pulmonary:     Effort: Pulmonary effort is normal. No respiratory distress.     Breath sounds: Normal breath sounds.  Skin:    Capillary Refill: Capillary refill takes less than 2 seconds.  Neurological:     General: No focal deficit present.     Mental Status: She is alert and oriented to person, place, and time.  Psychiatric:        Mood and Affect: Mood normal.        Behavior: Behavior normal.  Thought Content: Thought content normal.        Judgment: Judgment normal.         Assessment And Plan:     1. Essential hypertension . B/P is poorly controlled . Completed form for her to have a blood pressure cuff at home through her insurance . Encouraged to eat a low salt diet.  Marland Kitchen BMP ordered to check renal function.  . The importance of regular exercise and dietary modification was stressed to the patient.  . Stressed importance of losing ten percent of her body weight to help with B/P control.  . The weight loss would help with decreasing cardiac and cancer risk as well.  - BMP8+eGFR - Lipid Profile  2. Prediabetes  Chronic, controlled  Continue with current medications  Encouraged to limit  intake of sugary foods and drinks  Encouraged to increase physical activity to 150 minutes per week - Hemoglobin A1c  3. Obesity (BMI 35.0-39.9 without comorbidity)  Encouraged to walk at least 30 minutes per day 5 days a week  Eat a healthy diet low in carbohydrates and starches  4. Mixed hyperlipidemia  Chronic, controlled  Continue with current medications  No muscle weakness reported - Lipid Profile  5. Seasonal allergic rhinitis, unspecified trigger  Use steroid nasal spray as needed during peak season - fluticasone (FLONASE) 50 MCG/ACT nasal spray; Place 1 spray into both nostrils daily.  Dispense: 16 g; Refill: 2   [name], FNP    THE PATIENT IS ENCOURAGED TO PRACTICE SOCIAL DISTANCING DUE TO THE COVID-19 PANDEMIC.

## 2019-08-25 LAB — HEMOGLOBIN A1C
Est. average glucose Bld gHb Est-mCnc: 120 mg/dL
Hgb A1c MFr Bld: 5.8 % — ABNORMAL HIGH (ref 4.8–5.6)

## 2019-08-25 LAB — BMP8+EGFR
BUN/Creatinine Ratio: 16 (ref 12–28)
BUN: 14 mg/dL (ref 8–27)
CO2: 25 mmol/L (ref 20–29)
Calcium: 10.1 mg/dL (ref 8.7–10.3)
Chloride: 103 mmol/L (ref 96–106)
Creatinine, Ser: 0.89 mg/dL (ref 0.57–1.00)
GFR calc Af Amer: 80 mL/min/{1.73_m2} (ref >59)
GFR calc non Af Amer: 69 mL/min/{1.73_m2} (ref >59)
Glucose: 110 mg/dL — ABNORMAL HIGH (ref 65–99)
Potassium: 4.3 mmol/L (ref 3.5–5.2)
Sodium: 141 mmol/L (ref 134–144)

## 2019-08-25 LAB — LIPID PANEL
Chol/HDL Ratio: 3.7 ratio (ref 0.0–4.4)
Cholesterol, Total: 201 mg/dL — ABNORMAL HIGH (ref 100–199)
HDL: 54 mg/dL (ref >39)
LDL Chol Calc (NIH): 122 mg/dL — ABNORMAL HIGH (ref 0–99)
Triglycerides: 143 mg/dL (ref 0–149)
VLDL Cholesterol Cal: 25 mg/dL (ref 5–40)

## 2019-09-01 ENCOUNTER — Other Ambulatory Visit: Payer: Self-pay

## 2019-09-01 ENCOUNTER — Ambulatory Visit
Admission: RE | Admit: 2019-09-01 | Discharge: 2019-09-01 | Disposition: A | Discharge status: Home / Self Care | Payer: Federal, State, Local not specified - PPO | Source: Ambulatory Visit | Attending: Nurse Practitioner | Admitting: Nurse Practitioner

## 2019-09-01 DIAGNOSIS — Z1231 Encounter for screening mammogram for malignant neoplasm of breast: Secondary | ICD-10-CM

## 2019-09-29 ENCOUNTER — Ambulatory Visit: Payer: Federal, State, Local not specified - PPO | Admitting: Nurse Practitioner

## 2019-10-02 ENCOUNTER — Other Ambulatory Visit: Payer: Self-pay | Admitting: Nurse Practitioner

## 2019-10-18 ENCOUNTER — Other Ambulatory Visit: Payer: Self-pay

## 2019-10-19 NOTE — Progress Notes (Signed)
64 y.o. G1P0010 Married  African American Fe here for annual exam. Post menopausal with occasional hot flashes. No vaginal dryness or vaginal bleeding. Recent visit with PCP all normal with management of hypertension, cholesterol and glucose. All labs there also. Has been working on weight control and has lost 9 pounds. No other health issues today.  Patient's last menstrual period was 03/26/2004 (approximate).          Sexually active: No.  The current method of family planning is status post hysterectomy.    Exercising: Yes.    walking Smoker:  no  Review of Systems  Constitutional: Negative.   HENT: Negative.   Eyes: Negative.   Respiratory: Negative.   Cardiovascular: Negative.   Gastrointestinal: Negative.   Genitourinary: Negative.   Musculoskeletal: Negative.   Skin: Negative.   Neurological: Negative.   Endo/Heme/Allergies: Negative.   Psychiatric/Behavioral: Negative.     Health Maintenance: Pap:  2005 neg History of Abnormal Pap: no MMG:  09-01-2019 category a density birads 1:neg Self Breast exams: yes Colonoscopy:  2018 f/u 79yrs polyp removed BMD:   None declines scheduling at present TDaP: 03/2010 will update today Shingles: not done Pneumonia: not done Hep C and HIV: both neg 2016 Labs: if needed   reports that she has never smoked. She has never used smokeless tobacco. She reports that she does not drink alcohol or use drugs.  Past Medical History:  Diagnosis Date  . Fibroid   . Hyperlipidemia   . Hypertension    not on meds, working on diet and exercise  . OA (osteoarthritis) of knee    bilateral  . Vitamin D deficiency disease     Past Surgical History:  Procedure Laterality Date  . ABDOMINAL HYSTERECTOMY  04/09/04   fibroids, ovaries remain  . BREAST BIOPSY    . BREAST REDUCTION SURGERY Bilateral 2000  . BREAST SURGERY Left 1972   biopsy benign  . CHOLECYSTECTOMY, LAPAROSCOPIC  04/21/11  . REDUCTION MAMMAPLASTY Bilateral   . TONSILLECTOMY  age  64    Current Outpatient Medications  Medication Sig Dispense Refill  . amLODipine (NORVASC) 5 MG tablet TAKE 1 TABLET BY MOUTH EVERY DAY 90 tablet 0  . fluticasone (FLONASE) 50 MCG/ACT nasal spray Place 1 spray into both nostrils daily. 16 g 2  . hydrocortisone (ANUSOL-HC) 25 MG suppository Place 1 suppository (25 mg total) rectally 2 (two) times daily. 24 suppository 2  . metFORMIN (GLUCOPHAGE) 500 MG tablet TAKE 2 TABLETS BY MOUTH TWICE A DAY 360 tablet 1  . rosuvastatin (CRESTOR) 10 MG tablet Take 1 tablet (10 mg total) by mouth daily. 90 tablet 0   No current facility-administered medications for this visit.    Family History  Problem Relation Age of Onset  . Heart failure Mother   . Kidney disease Mother   . Multiple sclerosis Sister 52  . Breast cancer Neg Hx     ROS:  Pertinent items are noted in HPI.  Otherwise, a comprehensive ROS was negative.  Exam:   LMP 03/26/2004 (Approximate)    Ht Readings from Last 3 Encounters:  08/24/19 5' 4.6" (1.641 m)  04/08/19 5' 3.2" (1.605 m)  12/29/18 5' 3.4" (1.61 m)    General appearance: alert, cooperative and appears stated age Head: Normocephalic, without obvious abnormality, atraumatic Neck: no adenopathy, supple, symmetrical, trachea midline and thyroid normal to inspection and palpation Lungs: clear to auscultation bilaterally Breasts: normal appearance, no masses or tenderness, No nipple retraction or dimpling, No nipple discharge  or bleeding, No axillary or supraclavicular adenopathy, scarring from breast reduction bilateral Heart: regular rate and rhythm Abdomen: soft, non-tender; no masses,  no organomegaly Extremities: extremities normal, atraumatic, no cyanosis or edema Skin: Skin color, texture, turgor normal. No rashes or lesions Lymph nodes: Cervical, supraclavicular, and axillary nodes normal. No abnormal inguinal nodes palpated Neurologic: Grossly normal   Pelvic: External genitalia:  no lesions               Urethra:  normal appearing urethra with no masses, tenderness or lesions              Bartholin's and Skene's: normal                 Vagina: normal appearing vagina with normal color and discharge, no lesions              Cervix: absent              Pap taken: No. Bimanual Exam:  Uterus:  uterus absent              Adnexa: no mass, fullness, tenderness, adnexal normal               Rectovaginal: Confirms               Anus:  normal sphincter tone, no lesions  Chaperone present: yes  A:  Well Woman with normal exam  Post menopausal no HRT s/p TAH ovaries retained  Hypertension/cholesterol and glucose management with PCP  Immunization due  P:   Reviewed health and wellness pertinent to exam  Aware to call if vaginal dryness  Continue follow up with PCP as indicated  Requests TDAP  Pap smear: no   counseled on breast self exam, mammography screening, feminine hygiene, adequate intake of calcium and vitamin D, diet and exercise, Kegel's exercises return annually or prn  An After Visit Summary was printed and given to the patient.

## 2019-10-20 ENCOUNTER — Encounter: Payer: Self-pay | Admitting: Certified Nurse Midwife

## 2019-10-20 ENCOUNTER — Ambulatory Visit: Payer: Federal, State, Local not specified - PPO | Admitting: Certified Nurse Midwife

## 2019-10-20 ENCOUNTER — Other Ambulatory Visit: Payer: Self-pay

## 2019-10-20 VITALS — BP 130/70 | HR 70 | Temp 97.3°F | Resp 16 | Ht 64.25 in | Wt 215.0 lb

## 2019-10-20 DIAGNOSIS — Z01419 Encounter for gynecological examination (general) (routine) without abnormal findings: Secondary | ICD-10-CM

## 2019-10-20 DIAGNOSIS — Z23 Encounter for immunization: Secondary | ICD-10-CM

## 2019-10-20 NOTE — Patient Instructions (Addendum)
EXERCISE AND DIET:  We recommended that you start or continue a regular exercise program for good health. Regular exercise means any activity that makes your heart beat faster and makes you sweat.  We recommend exercising at least 30 minutes per day at least 3 days a week, preferably 4 or 5.  We also recommend a diet low in fat and sugar.  Inactivity, poor dietary choices and obesity can cause diabetes, heart attack, stroke, and kidney damage, among others.    ALCOHOL AND SMOKING:  Women should limit their alcohol intake to no more than 7 drinks/beers/glasses of wine (combined, not each!) per week. Moderation of alcohol intake to this level decreases your risk of breast cancer and liver damage. And of course, no recreational drugs are part of a healthy lifestyle.  And absolutely no smoking or even second hand smoke. Most people know smoking can cause heart and lung diseases, but did you know it also contributes to weakening of your bones? Aging of your skin?  Yellowing of your teeth and nails?  CALCIUM AND VITAMIN D:  Adequate intake of calcium and Vitamin D are recommended.  The recommendations for exact amounts of these supplements seem to change often, but generally speaking 600 mg of calcium (either carbonate or citrate) and 800 units of Vitamin D per day seems prudent. Certain women may benefit from higher intake of Vitamin D.  If you are among these women, your doctor will have told you during your visit.    PAP SMEARS:  Pap smears, to check for cervical cancer or precancers,  have traditionally been done yearly, although recent scientific advances have shown that most women can have pap smears less often.  However, every woman still should have a physical exam from her gynecologist every year. It will include a breast check, inspection of the vulva and vagina to check for abnormal growths or skin changes, a visual exam of the cervix, and then an exam to evaluate the size and shape of the uterus and  ovaries.  And after 64 years of age, a rectal exam is indicated to check for rectal cancers. We will also provide age appropriate advice regarding health maintenance, like when you should have certain vaccines, screening for sexually transmitted diseases, bone density testing, colonoscopy, mammograms, etc.   MAMMOGRAMS:  All women over 40 years old should have a yearly mammogram. Many facilities now offer a "3D" mammogram, which may cost around $50 extra out of pocket. If possible,  we recommend you accept the option to have the 3D mammogram performed.  It both reduces the number of women who will be called back for extra views which then turn out to be normal, and it is better than the routine mammogram at detecting truly abnormal areas.    COLONOSCOPY:  Colonoscopy to screen for colon cancer is recommended for all women at age 50.  We know, you hate the idea of the prep.  We agree, BUT, having colon cancer and not knowing it is worse!!  Colon cancer so often starts as a polyp that can be seen and removed at colonscopy, which can quite literally save your life!  And if your first colonoscopy is normal and you have no family history of colon cancer, most women don't have to have it again for 10 years.  Once every ten years, you can do something that may end up saving your life, right?  We will be happy to help you get it scheduled when you are ready.    Be sure to check your insurance coverage so you understand how much it will cost.  It may be covered as a preventative service at no cost, but you should check your particular policy.     https://www.cdc.gov/vaccines/hcp/vis/vis-statements/tdap.pdf">  Tdap (Tetanus, Diphtheria, Pertussis) Vaccine: What You Need to Know 1. Why get vaccinated? Tdap vaccine can prevent tetanus, diphtheria, and pertussis. Diphtheria and pertussis spread from person to person. Tetanus enters the body through cuts or wounds.  TETANUS (T) causes painful stiffening of the muscles.  Tetanus can lead to serious health problems, including being unable to open the mouth, having trouble swallowing and breathing, or death.  DIPHTHERIA (D) can lead to difficulty breathing, heart failure, paralysis, or death.  PERTUSSIS (aP), also known as "whooping cough," can cause uncontrollable, violent coughing which makes it hard to breathe, eat, or drink. Pertussis can be extremely serious in babies and young children, causing pneumonia, convulsions, brain damage, or death. In teens and adults, it can cause weight loss, loss of bladder control, passing out, and rib fractures from severe coughing. 2. Tdap vaccine Tdap is only for children 7 years and older, adolescents, and adults.  Adolescents should receive a single dose of Tdap, preferably at age 79 or 71 years. Pregnant women should get a dose of Tdap during every pregnancy, to protect the newborn from pertussis. Infants are most at risk for severe, life-threatening complications from pertussis. Adults who have never received Tdap should get a dose of Tdap. Also, adults should receive a booster dose every 10 years, or earlier in the case of a severe and dirty wound or burn. Booster doses can be either Tdap or Td (a different vaccine that protects against tetanus and diphtheria but not pertussis). Tdap may be given at the same time as other vaccines. 3. Talk with your health care provider Tell your vaccine provider if the person getting the vaccine:  Has had an allergic reaction after a previous dose of any vaccine that protects against tetanus, diphtheria, or pertussis, or has any severe, life-threatening allergies.  Has had a coma, decreased level of consciousness, or prolonged seizures within 7 days after a previous dose of any pertussis vaccine (DTP, DTaP, or Tdap).  Has seizures or another nervous system problem.  Has ever had Guillain-Barr Syndrome (also called GBS).  Has had severe pain or swelling after a previous dose of any  vaccine that protects against tetanus or diphtheria. In some cases, your health care provider may decide to postpone Tdap vaccination to a future visit.  People with minor illnesses, such as a cold, may be vaccinated. People who are moderately or severely ill should usually wait until they recover before getting Tdap vaccine.  Your health care provider can give you more information. 4. Risks of a vaccine reaction  Pain, redness, or swelling where the shot was given, mild fever, headache, feeling tired, and nausea, vomiting, diarrhea, or stomachache sometimes happen after Tdap vaccine. People sometimes faint after medical procedures, including vaccination. Tell your provider if you feel dizzy or have vision changes or ringing in the ears.  As with any medicine, there is a very remote chance of a vaccine causing a severe allergic reaction, other serious injury, or death. 5. What if there is a serious problem? An allergic reaction could occur after the vaccinated person leaves the clinic. If you see signs of a severe allergic reaction (hives, swelling of the face and throat, difficulty breathing, a fast heartbeat, dizziness, or weakness), call 9-1-1 and get the  person to the nearest hospital. For other signs that concern you, call your health care provider.  Adverse reactions should be reported to the Vaccine Adverse Event Reporting System (VAERS). Your health care provider will usually file this report, or you can do it yourself. Visit the VAERS website at www.vaers.SamedayNews.es or call 908 698 1641. VAERS is only for reporting reactions, and VAERS staff do not give medical advice. 6. The National Vaccine Injury Compensation Program The [employer] Vaccine Injury Compensation Program (VICP) is a federal program that was created to compensate people who may have been injured by certain vaccines. Visit the VICP website at GoldCloset.com.ee or call (820) 125-5303 to learn about the program and  about filing a claim. There is a time limit to file a claim for compensation. 7. How can I learn more?  Ask your health care provider.  Call your local or state health department.  Contact the Centers for Disease Control and Prevention (CDC): ? Call 671 303 8483 (1-800-CDC-INFO) or ? Visit CDC's website at http://hunter.com/ Vaccine Information Statement Tdap (Tetanus, Diphtheria, Pertussis) Vaccine (01/13/2019) This information is not intended to replace advice given to you by your health care provider. Make sure you discuss any questions you have with your health care provider. Document Revised: 01/22/2019 Document Reviewed: 01/25/2019 Elsevier Patient Education  [place].

## 2019-11-02 ENCOUNTER — Other Ambulatory Visit: Payer: Self-pay | Admitting: Nurse Practitioner

## 2019-11-07 ENCOUNTER — Other Ambulatory Visit: Payer: Self-pay | Admitting: Nurse Practitioner

## 2019-11-15 ENCOUNTER — Other Ambulatory Visit: Payer: Self-pay | Admitting: Nurse Practitioner

## 2019-11-15 DIAGNOSIS — J302 Other seasonal allergic rhinitis: Secondary | ICD-10-CM

## 2019-11-24 ENCOUNTER — Ambulatory Visit: Payer: Federal, State, Local not specified - PPO | Admitting: Nurse Practitioner

## 2020-01-03 ENCOUNTER — Ambulatory Visit: Payer: Federal, State, Local not specified - PPO | Attending: Internal Medicine

## 2020-01-03 DIAGNOSIS — Z23 Encounter for immunization: Secondary | ICD-10-CM

## 2020-01-03 NOTE — Progress Notes (Signed)
   Covid-19 Vaccination Clinic  Name:  Shirley Kent    MRN: [id number] DOB: 1955/11/10  01/03/2020  [name] was observed post Covid-19 immunization for 15 minutes without incident. She was provided with Vaccine Information Sheet and instruction to access the V-Safe system.   [name] was instructed to call 911 with any severe reactions post vaccine: Marland Kitchen Difficulty breathing  . Swelling of face and throat  . A fast heartbeat  . A bad rash all over body  . Dizziness and weakness   Immunizations Administered    Name Date Dose VIS Date Route   Pfizer COVID-19 Vaccine 01/03/2020  9:15 AM 0.3 mL 09/24/2019 Intramuscular   Manufacturer: [place]   Lot: [id number]   [place]: [id number]

## 2020-01-05 ENCOUNTER — Encounter: Payer: Self-pay | Admitting: Certified Nurse Midwife

## 2020-01-26 ENCOUNTER — Ambulatory Visit: Payer: Federal, State, Local not specified - PPO | Attending: Internal Medicine

## 2020-01-26 DIAGNOSIS — Z23 Encounter for immunization: Secondary | ICD-10-CM

## 2020-01-26 NOTE — Progress Notes (Signed)
   Covid-19 Vaccination Clinic  Name:  MANETTE BAYLEY    MRN: [id number] DOB: June 20, 1956  01/26/2020  Ms. Matkin was observed post Covid-19 immunization for 15 minutes without incident. She was provided with Vaccine Information Sheet and instruction to access the V-Safe system.   Ms. Mobilia was instructed to call 911 with any severe reactions post vaccine: Marland Kitchen Difficulty breathing  . Swelling of face and throat  . A fast heartbeat  . A bad rash all over body  . Dizziness and weakness   Immunizations Administered    Name Date Dose VIS Date Route   Pfizer COVID-19 Vaccine 01/26/2020 10:05 AM 0.3 mL 09/24/2019 Intramuscular   Manufacturer: [place]   Lot: [id number]   [place]: [id number]

## 2020-01-31 ENCOUNTER — Other Ambulatory Visit: Payer: Self-pay | Admitting: Nurse Practitioner

## 2020-02-02 ENCOUNTER — Other Ambulatory Visit: Payer: Self-pay | Admitting: Nurse Practitioner

## 2020-02-03 ENCOUNTER — Other Ambulatory Visit: Payer: Self-pay

## 2020-02-03 MED ORDER — ROSUVASTATIN CALCIUM 10 MG PO TABS: 10.0000 mg | ORAL_TABLET | Freq: Every day | ORAL | 0 refills | Status: DC

## 2020-02-15 ENCOUNTER — Other Ambulatory Visit: Payer: Self-pay | Admitting: Nurse Practitioner

## 2020-02-15 ENCOUNTER — Encounter: Payer: Self-pay | Admitting: Nurse Practitioner

## 2020-02-27 ENCOUNTER — Other Ambulatory Visit: Payer: Self-pay | Admitting: Nurse Practitioner

## 2020-03-21 ENCOUNTER — Other Ambulatory Visit: Payer: Self-pay | Admitting: Nurse Practitioner

## 2020-03-26 ENCOUNTER — Other Ambulatory Visit: Payer: Self-pay | Admitting: Nurse Practitioner

## 2020-04-12 ENCOUNTER — Ambulatory Visit: Payer: Federal, State, Local not specified - PPO | Admitting: Nurse Practitioner

## 2020-04-12 ENCOUNTER — Encounter: Payer: Self-pay | Admitting: Nurse Practitioner

## 2020-04-12 VITALS — BP 170/82 | HR 104 | Temp 98.2°F | Ht 63.6 in | Wt 213.4 lb

## 2020-04-12 DIAGNOSIS — E782 Mixed hyperlipidemia: Secondary | ICD-10-CM | POA: Diagnosis not present

## 2020-04-12 DIAGNOSIS — I10 Essential (primary) hypertension: Secondary | ICD-10-CM | POA: Diagnosis not present

## 2020-04-12 DIAGNOSIS — E669 Obesity, unspecified: Secondary | ICD-10-CM

## 2020-04-12 DIAGNOSIS — Z0000 Encounter for general adult medical examination without abnormal findings: Secondary | ICD-10-CM | POA: Diagnosis not present

## 2020-04-12 DIAGNOSIS — R7303 Prediabetes: Secondary | ICD-10-CM

## 2020-04-12 LAB — POCT URINALYSIS DIPSTICK
Bilirubin, UA: NEGATIVE
Glucose, UA: NEGATIVE
Ketones, UA: NEGATIVE
Leukocytes, UA: NEGATIVE
Nitrite, UA: NEGATIVE
Protein, UA: POSITIVE — AB
Spec Grav, UA: 1.025 (ref 1.010–1.025)
Urobilinogen, UA: 0.2 E.U./dL
pH, UA: 5.5 (ref 5.0–8.0)

## 2020-04-12 LAB — POCT UA - MICROALBUMIN
Albumin/Creatinine Ratio, Urine, POC: 30
Creatinine, POC: 300 mg/dL
Microalbumin Ur, POC: 80 mg/L

## 2020-04-12 MED ORDER — ROSUVASTATIN CALCIUM 10 MG PO TABS: 10.0000 mg | ORAL_TABLET | Freq: Every day | ORAL | 1 refills | Status: DC

## 2020-04-12 NOTE — Progress Notes (Signed)
This visit occurred during the SARS-CoV-2 public health emergency.  Safety protocols were in place, including screening questions prior to the visit, additional usage of staff PPE, and extensive cleaning of exam room while observing appropriate contact time as indicated for disinfecting solutions.  Subjective:     Patient ID: Shirley Kent , female    DOB: 1956-01-23 , 64 y.o.   MRN: [id number]   Chief Complaint  Patient presents with  . Annual Exam    HPI  Here for HM. She had COVID 19 vaccine and did well.   Wt Readings from Last 3 Encounters: 04/12/20 : 213 lb 6.4 oz (96.8 kg) 10/20/19 : 215 lb (97.5 kg) 08/24/19 : 211 lb 6.4 oz (95.9 kg)   Hypertension This is a chronic problem. The current episode started more than 1 year ago. The problem is controlled. Pertinent negatives include no anxiety. There are no associated agents to hypertension. Risk factors for coronary artery disease include obesity and sedentary lifestyle. Past treatments include diuretics. There are no compliance problems.  There is no history of angina. There is no history of chronic renal disease.    The patient states she uses none for birth control. Last LMP was Patient's last menstrual period was 03/26/2004 (approximate)..  Mammogram last done 09/01/2019.  Negative for: breast discharge, breast lump(s), breast pain and breast self exam.  Pertinent negatives include abnormal bleeding (hematology), anxiety, decreased libido, depression, difficulty falling sleep, dyspareunia, history of infertility, nocturia, sexual dysfunction, sleep disturbances, urinary incontinence, urinary urgency, vaginal discharge and vaginal itching. Diet regular. The patient states her exercise level is moderate, she walks 5 days a week.      The patient's tobacco use is:  Social History   Tobacco Use  Smoking Status Never Smoker  Smokeless Tobacco Never Used   She has been exposed to passive smoke. The patient's alcohol use is:   Social History   Substance and Sexual Activity  Alcohol Use No   Additional information: Last pap 2021.   Past Medical History:  Diagnosis Date  . Fibroid   . Hyperlipidemia   . Hypertension    not on meds, working on diet and exercise  . OA (osteoarthritis) of knee    bilateral  . Vitamin D deficiency disease      Family History  Problem Relation Age of Onset  . Heart failure Mother   . Kidney disease Mother   . Multiple sclerosis Sister 21  . Breast cancer Neg Hx      Current Outpatient Medications:  .  amLODipine (NORVASC) 5 MG tablet, TAKE 1 TABLET BY MOUTH EVERY DAY, Disp: 90 tablet, Rfl: 0 .  fluticasone (FLONASE) 50 MCG/ACT nasal spray, PLACE 1 SPRAY INTO BOTH NOSTRILS DAILY., Disp: 48 mL, Rfl: 1 .  hydrocortisone (ANUSOL-HC) 25 MG suppository, Place 1 suppository (25 mg total) rectally 2 (two) times daily. (Patient taking differently: Place 25 mg rectally as needed. ), Disp: 24 suppository, Rfl: 2 .  metFORMIN (GLUCOPHAGE) 500 MG tablet, TAKE 2 TABLETS BY MOUTH TWICE A DAY, Disp: 360 tablet, Rfl: 1 .  rosuvastatin (CRESTOR) 10 MG tablet, TAKE 1 TABLET BY MOUTH EVERY DAY, Disp: 30 tablet, Rfl: 0   No Known Allergies   Review of Systems   Today's Vitals   04/12/20 1038  BP: (!) 170/82  Pulse: (!) 104  Temp: 98.2 F (36.8 C)  TempSrc: Oral  Weight: 213 lb 6.4 oz (96.8 kg)  Height: 5' 3.6" (1.615 m)  PainSc: 0-No pain  Body mass index is 37.09 kg/m.   Objective:  Physical Exam Constitutional:      General: She is not in acute distress.    Appearance: Normal appearance. She is well-developed. She is obese.  HENT:     Head: Normocephalic and atraumatic.     Right Ear: Hearing, tympanic membrane, ear canal and external ear normal.     Left Ear: Hearing, tympanic membrane, ear canal and external ear normal.     Nose:     Comments: Deferred - masked    Mouth/Throat:     Comments: Deferred - masked Eyes:     General: Lids are normal.     Extraocular  Movements: Extraocular movements intact.     Conjunctiva/sclera: Conjunctivae normal.     Pupils: Pupils are equal, round, and reactive to light.     Funduscopic exam:    Right eye: No papilledema.        Left eye: No papilledema.  Neck:     Thyroid: No thyroid mass.     Vascular: No carotid bruit.  Cardiovascular:     Rate and Rhythm: Normal rate and regular rhythm.     Pulses: Normal pulses.     Heart sounds: Normal heart sounds. No murmur heard.   Pulmonary:     Effort: Pulmonary effort is normal. No respiratory distress.     Breath sounds: Normal breath sounds.  Abdominal:     General: Abdomen is flat. Bowel sounds are normal.     Palpations: Abdomen is soft.  Musculoskeletal:        General: No swelling. Normal range of motion.     Cervical back: Full passive range of motion without pain, normal range of motion and neck supple.     Right lower leg: No edema.     Left lower leg: No edema.  Skin:    General: Skin is warm and dry.     Capillary Refill: Capillary refill takes less than 2 seconds.  Neurological:     General: No focal deficit present.     Mental Status: She is alert and oriented to person, place, and time.     Cranial Nerves: No cranial nerve deficit.     Sensory: No sensory deficit.  Psychiatric:        Mood and Affect: Mood normal.        Behavior: Behavior normal.        Thought Content: Thought content normal.        Judgment: Judgment normal.         Assessment And Plan:     1. Health maintenance examination . Behavior modifications discussed and diet history reviewed.   . Pt will continue to exercise regularly and modify diet with low GI, plant based foods and decrease intake of processed foods.  . Recommend intake of daily multivitamin, Vitamin D, and calcium.  . Recommend mammogram and colonoscopy for preventive screenings, as well as recommend immunizations that include influenza, TDAP  2. Essential hypertension . B/P is elevated today,  encouraged to make sure she is taking her medications regularly . CMP ordered to check renal function.  . The importance of regular exercise and dietary modification was stressed to the patient.  - POCT Urinalysis Dipstick (81002) - POCT UA - Microalbumin - EKG 12-Lead - CBC no Diff - CMP14 + Anion Gap  3. Prediabetes  Chronic,stable  Continue with current medications, pending results of Hgb1c  Encouraged to limit intake of sugary foods and drinks  Encouraged to increase physical activity to 150 minutes per week as tolerated, can do chair exercises as well. - Hemoglobin A1c - CMP14 + Anion Gap  4. Mixed hyperlipidemia Chronic, controlled Continue with current medications and tolerating well - Lipid panel - CMP14 + Anion Gap - rosuvastatin (CRESTOR) 10 MG tablet; Take 1 tablet (10 mg total) by mouth daily.  Dispense: 90 tablet; Refill: 1  5. Obesity (BMI 35.0-39.9 without comorbidity)  Chronic  Discussed healthy diet and regular exercise options   Encouraged to exercise at least 150 minutes per week with 2 days of strength training     [name], FNP    THE PATIENT IS ENCOURAGED TO PRACTICE SOCIAL DISTANCING DUE TO THE COVID-19 PANDEMIC.

## 2020-04-12 NOTE — Patient Instructions (Signed)
Red Yeast Rice for cholesterol lowering.   Health Maintenance, Female Adopting a healthy lifestyle and getting preventive care are important in promoting health and wellness. Ask your health care provider about:  The right schedule for you to have regular tests and exams.  Things you can do on your own to prevent diseases and keep yourself healthy. What should I know about diet, weight, and exercise? Eat a healthy diet   Eat a diet that includes plenty of vegetables, fruits, low-fat dairy products, and lean protein.  Do not eat a lot of foods that are high in solid fats, added sugars, or sodium. Maintain a healthy weight Body mass index (BMI) is used to identify weight problems. It estimates body fat based on height and weight. Your health care provider can help determine your BMI and help you achieve or maintain a healthy weight. Get regular exercise Get regular exercise. This is one of the most important things you can do for your health. Most adults should:  Exercise for at least 150 minutes each week. The exercise should increase your heart rate and make you sweat (moderate-intensity exercise).  Do strengthening exercises at least twice a week. This is in addition to the moderate-intensity exercise.  Spend less time sitting. Even light physical activity can be beneficial. Watch cholesterol and blood lipids Have your blood tested for lipids and cholesterol at 64 years of age, then have this test every 5 years. Have your cholesterol levels checked more often if:  Your lipid or cholesterol levels are high.  You are older than 64 years of age.  You are at high risk for heart disease. What should I know about cancer screening? Depending on your health history and family history, you may need to have cancer screening at various ages. This may include screening for:  Breast cancer.  Cervical cancer.  Colorectal cancer.  Skin cancer.  Lung cancer. What should I know about  heart disease, diabetes, and high blood pressure? Blood pressure and heart disease  High blood pressure causes heart disease and increases the risk of stroke. This is more likely to develop in people who have high blood pressure readings, are of African descent, or are overweight.  Have your blood pressure checked: ? Every 3-5 years if you are 65-34 years of age. ? Every year if you are 80 years old or older. Diabetes Have regular diabetes screenings. This checks your fasting blood sugar level. Have the screening done:  Once every three years after age 60 if you are at a normal weight and have a low risk for diabetes.  More often and at a younger age if you are overweight or have a high risk for diabetes. What should I know about preventing infection? Hepatitis B If you have a higher risk for hepatitis B, you should be screened for this virus. Talk with your health care provider to find out if you are at risk for hepatitis B infection. Hepatitis C Testing is recommended for:  Everyone born from [birth year] through 1965.  Anyone with known risk factors for hepatitis C. Sexually transmitted infections (STIs)  Get screened for STIs, including gonorrhea and chlamydia, if: ? You are sexually active and are younger than 64 years of age. ? You are older than 64 years of age and your health care provider tells you that you are at risk for this type of infection. ? Your sexual activity has changed since you were last screened, and you are at increased risk for chlamydia  or gonorrhea. Ask your health care provider if you are at risk.  Ask your health care provider about whether you are at high risk for HIV. Your health care provider may recommend a prescription medicine to help prevent HIV infection. If you choose to take medicine to prevent HIV, you should first get tested for HIV. You should then be tested every 3 months for as long as you are taking the medicine. Pregnancy  If you are about to  stop having your period (premenopausal) and you may become pregnant, seek counseling before you get pregnant.  Take 400 to 800 micrograms (mcg) of folic acid every day if you become pregnant.  Ask for birth control (contraception) if you want to prevent pregnancy. Osteoporosis and menopause Osteoporosis is a disease in which the bones lose minerals and strength with aging. This can result in bone fractures. If you are 65 years old or older, or if you are at risk for osteoporosis and fractures, ask your health care provider if you should:  Be screened for bone loss.  Take a calcium or vitamin D supplement to lower your risk of fractures.  Be given hormone replacement therapy (HRT) to treat symptoms of menopause. Follow these instructions at home: Lifestyle  Do not use any products that contain nicotine or tobacco, such as cigarettes, e-cigarettes, and chewing tobacco. If you need help quitting, ask your health care provider.  Do not use street drugs.  Do not share needles.  Ask your health care provider for help if you need support or information about quitting drugs. Alcohol use  Do not drink alcohol if: ? Your health care provider tells you not to drink. ? You are pregnant, may be pregnant, or are planning to become pregnant.  If you drink alcohol: ? Limit how much you use to 0-1 drink a day. ? Limit intake if you are breastfeeding.  Be aware of how much alcohol is in your drink. In the [place] one drink equals one 12 oz bottle of beer (355 mL), one 5 oz glass of wine (148 mL), or one 1 oz glass of hard liquor (44 mL). General instructions  Schedule regular health, dental, and eye exams.  Stay current with your vaccines.  Tell your health care provider if: ? You often feel depressed. ? You have ever been abused or do not feel safe at home. Summary  Adopting a healthy lifestyle and getting preventive care are important in promoting health and wellness.  Follow your  health care provider's instructions about healthy diet, exercising, and getting tested or screened for diseases.  Follow your health care provider's instructions on monitoring your cholesterol and blood pressure. This information is not intended to replace advice given to you by your health care provider. Make sure you discuss any questions you have with your health care provider. Document Revised: 09/23/2018 Document Reviewed: 09/23/2018 Elsevier Patient Education  2020 Elsevier Inc.  

## 2020-04-13 LAB — CMP14 + ANION GAP
ALT: 10 IU/L (ref 0–32)
AST: 12 IU/L (ref 0–40)
Albumin/Globulin Ratio: 2 (ref 1.2–2.2)
Albumin: 4.9 g/dL — ABNORMAL HIGH (ref 3.8–4.8)
Alkaline Phosphatase: 81 IU/L (ref 48–121)
Anion Gap: 17 mmol/L (ref 10.0–18.0)
BUN/Creatinine Ratio: 15 (ref 12–28)
BUN: 15 mg/dL (ref 8–27)
Bilirubin Total: 0.4 mg/dL (ref 0.0–1.2)
CO2: 25 mmol/L (ref 20–29)
Calcium: 9.9 mg/dL (ref 8.7–10.3)
Chloride: 101 mmol/L (ref 96–106)
Creatinine, Ser: 0.98 mg/dL (ref 0.57–1.00)
GFR calc Af Amer: 71 mL/min/{1.73_m2} (ref >59)
GFR calc non Af Amer: 62 mL/min/{1.73_m2} (ref >59)
Globulin, Total: 2.4 g/dL (ref 1.5–4.5)
Glucose: 90 mg/dL (ref 65–99)
Potassium: 4.2 mmol/L (ref 3.5–5.2)
Sodium: 143 mmol/L (ref 134–144)
Total Protein: 7.3 g/dL (ref 6.0–8.5)

## 2020-04-13 LAB — CBC
Hematocrit: 38.6 % (ref 34.0–46.6)
Hemoglobin: 12.5 g/dL (ref 11.1–15.9)
MCH: 29.3 pg (ref 26.6–33.0)
MCHC: 32.4 g/dL (ref 31.5–35.7)
MCV: 91 fL (ref 79–97)
Platelets: 299 10*3/uL (ref 150–450)
RBC: 4.26 x10E6/uL (ref 3.77–5.28)
RDW: 13.2 % (ref 11.7–15.4)
WBC: 6.8 10*3/uL (ref 3.4–10.8)

## 2020-04-13 LAB — HEMOGLOBIN A1C
Est. average glucose Bld gHb Est-mCnc: 120 mg/dL
Hgb A1c MFr Bld: 5.8 % — ABNORMAL HIGH (ref 4.8–5.6)

## 2020-04-13 LAB — LIPID PANEL
Chol/HDL Ratio: 3.7 ratio (ref 0.0–4.4)
Cholesterol, Total: 183 mg/dL (ref 100–199)
HDL: 50 mg/dL (ref >39)
LDL Chol Calc (NIH): 112 mg/dL — ABNORMAL HIGH (ref 0–99)
Triglycerides: 119 mg/dL (ref 0–149)
VLDL Cholesterol Cal: 21 mg/dL (ref 5–40)

## 2020-04-14 ENCOUNTER — Other Ambulatory Visit: Payer: Self-pay | Admitting: Nurse Practitioner

## 2020-04-14 DIAGNOSIS — E782 Mixed hyperlipidemia: Secondary | ICD-10-CM

## 2020-04-18 ENCOUNTER — Ambulatory Visit: Payer: Federal, State, Local not specified - PPO

## 2020-04-18 ENCOUNTER — Other Ambulatory Visit: Payer: Self-pay

## 2020-04-18 VITALS — BP 144/76 | HR 80 | Temp 98.5°F | Ht 63.6 in | Wt 215.0 lb

## 2020-04-18 DIAGNOSIS — I10 Essential (primary) hypertension: Secondary | ICD-10-CM

## 2020-04-18 NOTE — Progress Notes (Signed)
Pt here for blood pressure check. Pt advised to continue with current medication. Pt will return in 8 weeks for htn check

## 2020-05-02 ENCOUNTER — Other Ambulatory Visit: Payer: Self-pay | Admitting: Nurse Practitioner

## 2020-05-02 ENCOUNTER — Other Ambulatory Visit: Payer: Self-pay

## 2020-05-02 DIAGNOSIS — K649 Unspecified hemorrhoids: Secondary | ICD-10-CM

## 2020-05-02 NOTE — Telephone Encounter (Signed)
Medication refill request: Hydrocortisone AC 25mg  suppository Last AEX:  10/20/19 Next AEX: 12/07/20 Last MMG (if hormonal medication request): NA Refill authorized: #24/2 refills   Last OV was with Melvia Heaps, CNM

## 2020-05-05 ENCOUNTER — Telehealth: Payer: Self-pay | Admitting: Obstetrics and Gynecology

## 2020-05-05 NOTE — Telephone Encounter (Signed)
Spoke with patient, advised patient that per Dr. Quincy Simmonds, she will need to be seen for an office visit prior to getting refills. Patient was a patient of Regina Eck, CNM and will switch to see Dr. Talbert Nan in 12/07/20. Patient declined to be seen in office and states that she will wait until February to be seen at [place]. Okay to close encounter.

## 2020-05-05 NOTE — Telephone Encounter (Signed)
Patient calling to check status on refill for suppository

## 2020-06-04 DIAGNOSIS — B029 Zoster without complications: Secondary | ICD-10-CM | POA: Diagnosis not present

## 2020-07-11 ENCOUNTER — Ambulatory Visit: Payer: Federal, State, Local not specified - PPO | Admitting: Nurse Practitioner

## 2020-07-11 ENCOUNTER — Encounter: Payer: Self-pay | Admitting: Nurse Practitioner

## 2020-07-11 ENCOUNTER — Other Ambulatory Visit: Payer: Self-pay

## 2020-07-11 VITALS — BP 144/82 | HR 94 | Temp 97.8°F | Ht 64.0 in | Wt 213.2 lb

## 2020-07-11 DIAGNOSIS — Z23 Encounter for immunization: Secondary | ICD-10-CM

## 2020-07-11 DIAGNOSIS — I10 Essential (primary) hypertension: Secondary | ICD-10-CM

## 2020-07-11 MED ORDER — AMLODIPINE BESYLATE 10 MG PO TABS: 10.0000 mg | ORAL_TABLET | Freq: Every day | ORAL | 1 refills | Status: DC

## 2020-07-11 NOTE — Patient Instructions (Signed)

## 2020-07-11 NOTE — Progress Notes (Signed)
I,Yamilka Roman [employer] as a Education administrator for [employer], FNP.,have documented all relevant documentation on the behalf of [name], FNP,as directed by  [name], FNP while in the presence of [name], [place]. This visit occurred during the SARS-CoV-2 public health emergency.  Safety protocols were in place, including screening questions prior to the visit, additional usage of staff PPE, and extensive cleaning of exam room while observing appropriate contact time as indicated for disinfecting solutions.  Subjective:     Patient ID: Shirley Kent , female    DOB: 03-Feb-1956 , 64 y.o.   MRN: [id number]   Chief Complaint  Patient presents with  . Hypertension    HPI  Patient here for a bp check.  Wt Readings from Last 3 Encounters: 07/11/20 : 213 lb 3.2 oz (96.7 kg) 04/18/20 : 215 lb (97.5 kg) 04/12/20 : 213 lb 6.4 oz (96.8 kg)  Yesterday her blood pressure was up in the morning and the evening was 833 systolic.    Hypertension This is a chronic problem. The current episode started more than 1 year ago. The problem is unchanged. The problem is uncontrolled. Pertinent negatives include no anxiety, blurred vision, chest pain, headaches, palpitations or shortness of breath. There are no associated agents to hypertension. Risk factors for coronary artery disease include obesity and sedentary lifestyle. Past treatments include calcium channel blockers. The current treatment provides mild improvement. There are no compliance problems.  There is no history of angina or kidney disease. There is no history of chronic renal disease.     Past Medical History:  Diagnosis Date  . Fibroid   . Hyperlipidemia   . Hypertension    not on meds, working on diet and exercise  . OA (osteoarthritis) of knee    bilateral  . Vitamin D deficiency disease      Family History  Problem Relation Age of Onset  . Heart failure Mother   . Kidney disease Mother   . Multiple sclerosis Sister 16   . Breast cancer Neg Hx      Current Outpatient Medications:  .  fluticasone (FLONASE) 50 MCG/ACT nasal spray, PLACE 1 SPRAY INTO BOTH NOSTRILS DAILY., Disp: 48 mL, Rfl: 1 .  hydrocortisone (ANUSOL-HC) 25 MG suppository, Place 1 suppository (25 mg total) rectally 2 (two) times daily. (Patient taking differently: Place 25 mg rectally as needed. ), Disp: 24 suppository, Rfl: 2 .  metFORMIN (GLUCOPHAGE) 500 MG tablet, TAKE 2 TABLETS BY MOUTH TWICE A DAY, Disp: 360 tablet, Rfl: 1 .  rosuvastatin (CRESTOR) 10 MG tablet, TAKE 1 TABLET BY MOUTH EVERY DAY, Disp: 90 tablet, Rfl: 1 .  amLODipine (NORVASC) 10 MG tablet, Take 1 tablet (10 mg total) by mouth daily., Disp: 90 tablet, Rfl: 1   No Known Allergies   Review of Systems  Constitutional: Negative.  Negative for fatigue.  Eyes: Negative for blurred vision.  Respiratory: Negative for shortness of breath.   Cardiovascular: Negative for chest pain and palpitations.  Neurological: Negative for dizziness and headaches.  Psychiatric/Behavioral: Negative.      Today's Vitals   07/11/20 0927  BP: (!) 144/82  Pulse: 94  Temp: 97.8 F (36.6 C)  TempSrc: Oral  Weight: 213 lb 3.2 oz (96.7 kg)  Height: 5\' 4"  (1.626 m)  PainSc: 0-No pain   Body mass index is 36.6 kg/m.   Objective:  Physical Exam Vitals reviewed.  Constitutional:      General: She is not in acute distress.    Appearance:  Normal appearance.  Cardiovascular:     Rate and Rhythm: Normal rate and regular rhythm.     Pulses: Normal pulses.     Heart sounds: Normal heart sounds. No murmur heard.   Neurological:     Mental Status: She is alert.         Assessment And Plan:     1. Essential hypertension  Chronic, blood pressure is stable   Continue with 10 mg amlodipine daily  Encouraged to limit salt intake - amLODipine (NORVASC) 10 MG tablet; Take 1 tablet (10 mg total) by mouth daily.  Dispense: 90 tablet; Refill: 1  2. Need for influenza  vaccination  Influenza vaccine administered  Encouraged to take Tylenol as needed for fever or muscle aches. - Flu Vaccine QUAD 6+ mos PF IM (Fluarix Quad PF)     Patient was given opportunity to ask questions. Patient verbalized understanding of the plan and was able to repeat key elements of the plan. All questions were answered to their satisfaction.   Teola Bradley, FNP, have reviewed all documentation for this visit. The documentation on 07/11/20 for the exam, diagnosis, procedures, and orders are all accurate and complete.  THE PATIENT IS ENCOURAGED TO PRACTICE SOCIAL DISTANCING DUE TO THE COVID-19 PANDEMIC.

## 2020-07-30 ENCOUNTER — Other Ambulatory Visit: Payer: Self-pay | Admitting: Nurse Practitioner

## 2020-07-31 ENCOUNTER — Other Ambulatory Visit: Payer: Self-pay

## 2020-07-31 DIAGNOSIS — I10 Essential (primary) hypertension: Secondary | ICD-10-CM

## 2020-07-31 MED ORDER — AMLODIPINE BESYLATE 10 MG PO TABS: 10.0000 mg | ORAL_TABLET | Freq: Every day | ORAL | 1 refills | Status: DC

## 2020-09-14 ENCOUNTER — Other Ambulatory Visit: Payer: Self-pay | Admitting: Nurse Practitioner

## 2020-10-19 ENCOUNTER — Other Ambulatory Visit: Payer: Self-pay | Admitting: Nurse Practitioner

## 2020-10-19 DIAGNOSIS — Z1231 Encounter for screening mammogram for malignant neoplasm of breast: Secondary | ICD-10-CM

## 2020-10-20 ENCOUNTER — Ambulatory Visit: Payer: Federal, State, Local not specified - PPO | Admitting: Certified Nurse Midwife

## 2020-11-13 ENCOUNTER — Encounter: Payer: Self-pay | Admitting: Nurse Practitioner

## 2020-11-13 ENCOUNTER — Ambulatory Visit: Payer: Federal, State, Local not specified - PPO | Admitting: Nurse Practitioner

## 2020-11-13 ENCOUNTER — Other Ambulatory Visit: Payer: Self-pay

## 2020-11-13 VITALS — BP 148/80 | HR 96 | Temp 98.3°F | Ht 64.0 in | Wt 215.6 lb

## 2020-11-13 DIAGNOSIS — I10 Essential (primary) hypertension: Secondary | ICD-10-CM

## 2020-11-13 DIAGNOSIS — E782 Mixed hyperlipidemia: Secondary | ICD-10-CM | POA: Diagnosis not present

## 2020-11-13 DIAGNOSIS — E559 Vitamin D deficiency, unspecified: Secondary | ICD-10-CM | POA: Diagnosis not present

## 2020-11-13 DIAGNOSIS — R351 Nocturia: Secondary | ICD-10-CM

## 2020-11-13 DIAGNOSIS — Z6837 Body mass index (BMI) 37.0-37.9, adult: Secondary | ICD-10-CM

## 2020-11-13 DIAGNOSIS — R7303 Prediabetes: Secondary | ICD-10-CM | POA: Diagnosis not present

## 2020-11-13 NOTE — Progress Notes (Signed)
I,Yamilka Roman [employer] as a Education administrator for [employer], FNP.,have documented all relevant documentation on the behalf of [name], FNP,as directed by  [name], FNP while in the presence of [name], [place]. This visit occurred during the SARS-CoV-2 public health emergency.  Safety protocols were in place, including screening questions prior to the visit, additional usage of staff PPE, and extensive cleaning of exam room while observing appropriate contact time as indicated for disinfecting solutions.  Subjective:     Patient ID: Shirley Kent , female    DOB: 09/05/1956 , 65 y.o.   MRN: [id number]   Chief Complaint  Patient presents with  . Hypertension    HPI  Patient presents today for a blood pressure f/u. Reports in the morning her blood pressure is elevated but improves over the course of the day.  She reports she wakes up frequently at night will have some nocturia.  She has not been tested for sleep apnea. She walks daily and tries active.    Wt Readings from Last 3 Encounters: 11/13/20 : 215 lb 9.6 oz (97.8 kg) 07/11/20 : 213 lb 3.2 oz (96.7 kg) 04/18/20 : 215 lb (97.5 kg)  Hypertension This is a chronic problem. The current episode started more than 1 year ago. The problem is unchanged. The problem is uncontrolled. Pertinent negatives include no anxiety, blurred vision, chest pain, headaches, palpitations or shortness of breath. There are no associated agents to hypertension. Risk factors for coronary artery disease include obesity and sedentary lifestyle. Past treatments include calcium channel blockers. The current treatment provides mild improvement. There are no compliance problems.  There is no history of angina or kidney disease. There is no history of chronic renal disease.     Past Medical History:  Diagnosis Date  . Fibroid   . Hyperlipidemia   . Hypertension    not on meds, working on diet and exercise  . OA (osteoarthritis) of knee    bilateral   . Vitamin D deficiency disease      Family History  Problem Relation Age of Onset  . Heart failure Mother   . Kidney disease Mother   . Multiple sclerosis Sister 18  . Breast cancer Neg Hx      Current Outpatient Medications:  .  amLODipine (NORVASC) 10 MG tablet, Take 1 tablet (10 mg total) by mouth daily., Disp: 90 tablet, Rfl: 1 .  fluticasone (FLONASE) 50 MCG/ACT nasal spray, PLACE 1 SPRAY INTO BOTH NOSTRILS DAILY., Disp: 48 mL, Rfl: 1 .  hydrocortisone (ANUSOL-HC) 25 MG suppository, Place 1 suppository (25 mg total) rectally 2 (two) times daily. (Patient taking differently: Place 25 mg rectally as needed.), Disp: 24 suppository, Rfl: 2 .  metFORMIN (GLUCOPHAGE) 500 MG tablet, TAKE 2 TABLETS BY MOUTH TWICE A DAY, Disp: 360 tablet, Rfl: 1 .  rosuvastatin (CRESTOR) 10 MG tablet, TAKE 1 TABLET BY MOUTH EVERY DAY, Disp: 90 tablet, Rfl: 1   No Known Allergies   Review of Systems  Constitutional: Negative.  Negative for fatigue.  Eyes: Negative for blurred vision.  Respiratory: Negative for shortness of breath and wheezing.   Cardiovascular: Negative for chest pain, palpitations and leg swelling.  Neurological: Negative for headaches.  Psychiatric/Behavioral: Negative.      Today's Vitals   11/13/20 0952 11/13/20 1009  BP: (!) 150/80 (!) 148/80  Pulse: 96   Temp: 98.3 F (36.8 C)   TempSrc: Oral   Weight: 215 lb 9.6 oz (97.8 kg)   Height: $Remove'5\' 4"'JmzNGOE$  (  1.626 m)   PainSc: 0-No pain    Body mass index is 37.01 kg/m.   Objective:  Physical Exam Vitals reviewed.  Constitutional:      General: She is not in acute distress.    Appearance: Normal appearance. She is well-developed. She is obese.  HENT:     Head: Normocephalic and atraumatic.  Eyes:     Pupils: Pupils are equal, round, and reactive to light.  Cardiovascular:     Rate and Rhythm: Normal rate and regular rhythm.     Pulses: Normal pulses.     Heart sounds: Normal heart sounds. No murmur heard.   Pulmonary:      Effort: Pulmonary effort is normal. No respiratory distress.     Breath sounds: Normal breath sounds. No wheezing.  Musculoskeletal:        General: Normal range of motion.  Skin:    General: Skin is warm and dry.     Capillary Refill: Capillary refill takes less than 2 seconds.  Neurological:     General: No focal deficit present.     Mental Status: She is alert and oriented to person, place, and time.     Cranial Nerves: No cranial nerve deficit.     Motor: No weakness.  Psychiatric:        Mood and Affect: Mood normal.        Behavior: Behavior normal.        Thought Content: Thought content normal.        Judgment: Judgment normal.         Assessment And Plan:     1. Essential hypertension  Chronic, fair control  Elevated today slightly  Will refer for sleep study as this can affect her blood pressure - BMP8+eGFR - Ambulatory referral to Sleep Studies  2. Mixed hyperlipidemia  Chronic, controlled  Continue with current medications, tolerating medications well - Lipid panel - BMP8+eGFR  3. Vitamin D deficiency  Will check vitamin D level and supplement as needed.     Also encouraged to spend 15 minutes in the sun daily.   4. Prediabetes  Chronic,stable  Continue with current medications, tolerating well  Encouraged to limit intake of sugary foods and drinks  Encouraged to increase physical activity to 150 minutes per week - Hemoglobin A1c  5. BMI 37.0-37.9, adult  Chronic  Discussed healthy diet and regular exercise options   Encouraged to exercise at least 150 minutes per week with 2 days of strength training - Ambulatory referral to Sleep Studies  6. Nocturia  This could be a sign for sleep apnea, will refer for a sleep study - Ambulatory referral to Sleep Studies     Patient was given opportunity to ask questions. Patient verbalized understanding of the plan and was able to repeat key elements of the plan. All questions were  answered to their satisfaction.  [name], FNP   I, [name], FNP, have reviewed all documentation for this visit. The documentation on 11/13/20 for the exam, diagnosis, procedures, and orders are all accurate and complete.   THE PATIENT IS ENCOURAGED TO PRACTICE SOCIAL DISTANCING DUE TO THE COVID-19 PANDEMIC.

## 2020-11-13 NOTE — Patient Instructions (Signed)

## 2020-11-14 LAB — HEMOGLOBIN A1C
Est. average glucose Bld gHb Est-mCnc: 126 mg/dL
Hgb A1c MFr Bld: 6 % — ABNORMAL HIGH (ref 4.8–5.6)

## 2020-11-14 LAB — BMP8+EGFR
BUN/Creatinine Ratio: 15 (ref 12–28)
BUN: 15 mg/dL (ref 8–27)
CO2: 25 mmol/L (ref 20–29)
Calcium: 10.2 mg/dL (ref 8.7–10.3)
Chloride: 99 mmol/L (ref 96–106)
Creatinine, Ser: 1 mg/dL (ref 0.57–1.00)
GFR calc Af Amer: 69 mL/min/{1.73_m2} (ref >59)
GFR calc non Af Amer: 60 mL/min/{1.73_m2} (ref >59)
Glucose: 106 mg/dL — ABNORMAL HIGH (ref 65–99)
Potassium: 4.3 mmol/L (ref 3.5–5.2)
Sodium: 140 mmol/L (ref 134–144)

## 2020-11-14 LAB — LIPID PANEL
Chol/HDL Ratio: 4.5 ratio — ABNORMAL HIGH (ref 0.0–4.4)
Cholesterol, Total: 220 mg/dL — ABNORMAL HIGH (ref 100–199)
HDL: 49 mg/dL (ref >39)
LDL Chol Calc (NIH): 143 mg/dL — ABNORMAL HIGH (ref 0–99)
Triglycerides: 158 mg/dL — ABNORMAL HIGH (ref 0–149)
VLDL Cholesterol Cal: 28 mg/dL (ref 5–40)

## 2020-11-29 ENCOUNTER — Ambulatory Visit
Admission: RE | Admit: 2020-11-29 | Discharge: 2020-11-29 | Disposition: A | Discharge status: Home / Self Care | Payer: Federal, State, Local not specified - PPO | Source: Ambulatory Visit | Attending: Nurse Practitioner | Admitting: Nurse Practitioner

## 2020-11-29 ENCOUNTER — Other Ambulatory Visit: Payer: Self-pay

## 2020-11-29 DIAGNOSIS — Z1231 Encounter for screening mammogram for malignant neoplasm of breast: Secondary | ICD-10-CM | POA: Diagnosis not present

## 2020-12-06 NOTE — Progress Notes (Signed)
65 y.o. G1P0010 Married Black or [place] American Not Hispanic or Latino female here for annual exam.    H/O hysterectomy (ovaries retained). No vaginal bleeding. Not sexually active. No bowel or bladder c/o.   She has a h/o occasional hemorrhoidal flares, uses hydrocortisone suppositories prn    Patient's last menstrual period was 03/26/2004 (approximate).          Sexually active: No.  The current method of family planning is post menopausal status.    Exercising: Yes.    walking Smoker:  no  Health Maintenance: Pap:  2005 History of abnormal Pap:  no MMG:  11/29/20 density A  Bi-rads 1 neg  BMD:   none Colonoscopy: 2018 f/u 11yrs polyp removed TDaP:  61/6/21 Gardasil: none   reports that she has never smoked. She has never used smokeless tobacco. She reports that she does not drink alcohol and does not use drugs. Retired from [employer].   Past Medical History:  Diagnosis Date  . Fibroid   . Hyperlipidemia   . Hypertension    not on meds, working on diet and exercise  . OA (osteoarthritis) of knee    bilateral  . Prediabetes   . Vitamin D deficiency disease     Past Surgical History:  Procedure Laterality Date  . ABDOMINAL HYSTERECTOMY  04/09/04   fibroids, ovaries remain  . BREAST BIOPSY    . BREAST REDUCTION SURGERY Bilateral 2000  . BREAST SURGERY Left 1972   biopsy benign  . CHOLECYSTECTOMY, LAPAROSCOPIC  04/21/11  . REDUCTION MAMMAPLASTY Bilateral   . TONSILLECTOMY  age 20    Current Outpatient Medications  Medication Sig Dispense Refill  . amLODipine (NORVASC) 10 MG tablet Take 1 tablet (10 mg total) by mouth daily. 90 tablet 1  . hydrocortisone (ANUSOL-HC) 25 MG suppository Place 1 suppository (25 mg total) rectally 2 (two) times daily. (Patient taking differently: Place 25 mg rectally as needed.) 24 suppository 2  . metFORMIN (GLUCOPHAGE) 500 MG tablet TAKE 2 TABLETS BY MOUTH TWICE A DAY 360 tablet 1  . rosuvastatin (CRESTOR) 10 MG tablet TAKE 1 TABLET BY MOUTH  EVERY DAY 90 tablet 1  . fluticasone (FLONASE) 50 MCG/ACT nasal spray PLACE 1 SPRAY INTO BOTH NOSTRILS DAILY. 48 mL 1   No current facility-administered medications for this visit.    Family History  Problem Relation Age of Onset  . Heart failure Mother   . Kidney disease Mother   . Multiple sclerosis Sister 42  . Breast cancer Neg Hx     Review of Systems  Psychiatric/Behavioral: The patient is nervous/anxious.   All other systems reviewed and are negative.   Exam:   BP 136/72   Pulse (!) 112   Ht 5\' 4"  (1.626 m)   Wt 218 lb (98.9 kg)   LMP 03/26/2004 (Approximate)   SpO2 99%   BMI 37.42 kg/m   Weight change: @WEIGHTCHANGE @ Height:   Height: 5\' 4"  (162.6 cm)  Ht Readings from Last 3 Encounters:  12/07/20 5\' 4"  (1.626 m)  11/13/20 5\' 4"  (1.626 m)  07/11/20 5\' 4"  (1.626 m)    General appearance: alert, cooperative and appears stated age Head: Normocephalic, without obvious abnormality, atraumatic Neck: no adenopathy, supple, symmetrical, trachea midline and thyroid normal to inspection and palpation Lungs: clear to auscultation bilaterally Cardiovascular: regular rate and rhythm Breasts: normal appearance, no masses or tenderness Abdomen: soft, non-tender; non distended,  no masses,  no organomegaly Extremities: extremities normal, atraumatic, no cyanosis or edema Skin: Skin  color, texture, turgor normal. No rashes or lesions Lymph nodes: Cervical, supraclavicular, and axillary nodes normal. No abnormal inguinal nodes palpated Neurologic: Grossly normal   Pelvic: External genitalia:  no lesions              Urethra:  normal appearing urethra with no masses, tenderness or lesions              Bartholins and Skenes: normal                 Vagina: normal appearing vagina with normal color and discharge, no lesions              Cervix: absent               Bimanual Exam:  Uterus:  uterus absent              Adnexa: no mass, fullness, tenderness                Rectovaginal: Confirms               Anus:  normal sphincter tone, no lesions  Gae Dry chaperoned for the exam.  1. Well woman exam with routine gynecological exam Discussed breast self exam Discussed calcium and vit D intake DEXA order for after her 44 birthday Mammogram UTD Colonoscopy next year Labs with primary  2. History of hemorrhoids Occasional flares, uses steroid prn.  - hydrocortisone (ANUSOL-HC) 25 MG suppository; Place 1 suppository (25 mg total) rectally 2 (two) times daily. For up to one week  Dispense: 28 suppository; Refill: 2  3. Hypoestrogenism  - DG Bone Density; Future

## 2020-12-07 ENCOUNTER — Ambulatory Visit: Payer: Federal, State, Local not specified - PPO | Admitting: Obstetrics and Gynecology

## 2020-12-07 ENCOUNTER — Other Ambulatory Visit: Payer: Self-pay

## 2020-12-07 ENCOUNTER — Encounter: Payer: Self-pay | Admitting: Obstetrics and Gynecology

## 2020-12-07 VITALS — BP 136/72 | HR 112 | Ht 64.0 in | Wt 218.0 lb

## 2020-12-07 DIAGNOSIS — Z01419 Encounter for gynecological examination (general) (routine) without abnormal findings: Secondary | ICD-10-CM

## 2020-12-07 DIAGNOSIS — E2839 Other primary ovarian failure: Secondary | ICD-10-CM | POA: Diagnosis not present

## 2020-12-07 DIAGNOSIS — K648 Other hemorrhoids: Secondary | ICD-10-CM | POA: Insufficient documentation

## 2020-12-07 DIAGNOSIS — Z1211 Encounter for screening for malignant neoplasm of colon: Secondary | ICD-10-CM | POA: Insufficient documentation

## 2020-12-07 DIAGNOSIS — Z860100 Personal history of colon polyps, unspecified: Secondary | ICD-10-CM | POA: Insufficient documentation

## 2020-12-07 DIAGNOSIS — Z8719 Personal history of other diseases of the digestive system: Secondary | ICD-10-CM | POA: Diagnosis not present

## 2020-12-07 MED ORDER — HYDROCORTISONE ACETATE 25 MG RE SUPP: 25.0000 mg | Freq: Two times a day (BID) | RECTAL | 2 refills | Status: DC

## 2020-12-07 NOTE — Patient Instructions (Signed)

## 2020-12-12 ENCOUNTER — Institutional Professional Consult (permissible substitution): Payer: Federal, State, Local not specified - PPO | Admitting: Neurology

## 2020-12-13 ENCOUNTER — Other Ambulatory Visit: Payer: Self-pay | Admitting: Nurse Practitioner

## 2020-12-13 DIAGNOSIS — E782 Mixed hyperlipidemia: Secondary | ICD-10-CM

## 2021-02-12 ENCOUNTER — Encounter: Payer: Self-pay | Admitting: Nurse Practitioner

## 2021-02-12 ENCOUNTER — Other Ambulatory Visit: Payer: Self-pay

## 2021-02-12 ENCOUNTER — Ambulatory Visit: Payer: Federal, State, Local not specified - PPO | Admitting: Nurse Practitioner

## 2021-02-12 VITALS — BP 142/70 | HR 94 | Temp 98.1°F | Ht 63.4 in | Wt 202.8 lb

## 2021-02-12 DIAGNOSIS — E782 Mixed hyperlipidemia: Secondary | ICD-10-CM

## 2021-02-12 DIAGNOSIS — R7303 Prediabetes: Secondary | ICD-10-CM | POA: Diagnosis not present

## 2021-02-12 DIAGNOSIS — E559 Vitamin D deficiency, unspecified: Secondary | ICD-10-CM

## 2021-02-12 DIAGNOSIS — I10 Essential (primary) hypertension: Secondary | ICD-10-CM | POA: Diagnosis not present

## 2021-02-12 DIAGNOSIS — E669 Obesity, unspecified: Secondary | ICD-10-CM

## 2021-02-12 DIAGNOSIS — R351 Nocturia: Secondary | ICD-10-CM

## 2021-02-12 DIAGNOSIS — Z6835 Body mass index (BMI) 35.0-35.9, adult: Secondary | ICD-10-CM

## 2021-02-12 NOTE — Progress Notes (Deleted)
I,Yamilka Roman [employer] as a Education administrator for [employer], FNP.,have documented all relevant documentation on the behalf of [name], FNP,as directed by  [name], FNP while in the presence of [name], [place]. This visit occurred during the SARS-CoV-2 public health emergency.  Safety protocols were in place, including screening questions prior to the visit, additional usage of staff PPE, and extensive cleaning of exam room while observing appropriate contact time as indicated for disinfecting solutions.  Subjective:     Patient ID: Shirley Kent , female    DOB: May 31, 1956 , 65 y.o.   MRN: [id number]   Chief Complaint  Patient presents with  . Hypertension    HPI  Patient presents today for a blood pressure f/u. She continues to eat potato chips.    Wt Readings from Last 3 Encounters: 02/12/21 : 202 lb 12.8 oz (92 kg) 12/07/20 : 218 lb (98.9 kg) 11/13/20 : 215 lb 9.6 oz (97.8 kg)   Hypertension This is a chronic problem. The current episode started more than 1 year ago. The problem is unchanged. The problem is uncontrolled. Pertinent negatives include no anxiety, blurred vision, chest pain, headaches, palpitations or shortness of breath. There are no associated agents to hypertension. Risk factors for coronary artery disease include obesity and sedentary lifestyle. Past treatments include calcium channel blockers. The current treatment provides mild improvement. There are no compliance problems.  There is no history of angina or kidney disease. There is no history of chronic renal disease.     Past Medical History:  Diagnosis Date  . Fibroid   . Hyperlipidemia   . Hypertension    not on meds, working on diet and exercise  . OA (osteoarthritis) of knee    bilateral  . Prediabetes   . Vitamin D deficiency disease      Family History  Problem Relation Age of Onset  . Heart failure Mother   . Kidney disease Mother   . Multiple sclerosis Sister 37  . Breast  cancer Neg Hx      Current Outpatient Medications:  .  amLODipine (NORVASC) 10 MG tablet, Take 1 tablet (10 mg total) by mouth daily., Disp: 90 tablet, Rfl: 1 .  fluticasone (FLONASE) 50 MCG/ACT nasal spray, PLACE 1 SPRAY INTO BOTH NOSTRILS DAILY., Disp: 48 mL, Rfl: 1 .  hydrocortisone (ANUSOL-HC) 25 MG suppository, Place 1 suppository (25 mg total) rectally 2 (two) times daily. For up to one week, Disp: 28 suppository, Rfl: 2 .  metFORMIN (GLUCOPHAGE) 500 MG tablet, TAKE 2 TABLETS BY MOUTH TWICE A DAY, Disp: 360 tablet, Rfl: 1 .  rosuvastatin (CRESTOR) 10 MG tablet, TAKE 1 TABLET BY MOUTH EVERY DAY, Disp: 90 tablet, Rfl: 1   No Known Allergies   Review of Systems  Constitutional: Negative.   Eyes: Negative for blurred vision.  Respiratory: Negative for shortness of breath and wheezing.   Cardiovascular: Negative for chest pain, palpitations and leg swelling.  Neurological: Negative for headaches.  Psychiatric/Behavioral: Negative.      Today's Vitals   02/12/21 0834  BP: (!) 142/70  Pulse: 94  Temp: 98.1 F (36.7 C)  TempSrc: Oral  Weight: 202 lb 12.8 oz (92 kg)  Height: 5' 3.4" (1.61 m)  PainSc: 0-No pain   Body mass index is 35.47 kg/m.   Objective:  Physical Exam Vitals reviewed.  Constitutional:      General: She is not in acute distress.    Appearance: Normal appearance.  Cardiovascular:     Rate  and Rhythm: Normal rate and regular rhythm.     Pulses: Normal pulses.     Heart sounds: Normal heart sounds. No murmur heard.   Pulmonary:     Effort: Pulmonary effort is normal. No respiratory distress.     Breath sounds: Normal breath sounds.  Skin:    Capillary Refill: Capillary refill takes less than 2 seconds.  Neurological:     General: No focal deficit present.     Mental Status: She is alert and oriented to person, place, and time.     Cranial Nerves: No cranial nerve deficit.     Motor: No weakness.  Psychiatric:        Mood and Affect: Mood normal.         Behavior: Behavior normal.        Thought Content: Thought content normal.        Judgment: Judgment normal.         Assessment And Plan:     1. Essential hypertension  Chronic, remains slightly elevated  She is advised to limit intake of potato chips  I would like to start another blood pressure medication however the patient does not want to start one at this time. She is willing to try to cut back on the potato chips.  She will return in 8 weeks.   She is encouraged to check her blood pressure at home regularly to be sure it stays at least where it is or lower.   Explained to patient uncontrolled hypertension can cause chronic kidney disease - CMP14+EGFR  2. Mixed hyperlipidemia  Chronic, controlled  Continue with current medications, tolerating medication well - Lipid panel - CMP14+EGFR  3. Vitamin D deficiency  Will check vitamin D level and supplement as needed.     Also encouraged to spend 15 minutes in the sun daily.  - VITAMIN D 25 Hydroxy (Vit-D Deficiency, Fractures)  4. Prediabetes  Chronic, stable  Continue with current medications  Encouraged to limit intake of sugary foods and drinks  Encouraged to increase physical activity to 150 minutes per week as tolerated - Hemoglobin A1c  5. Obesity (BMI 35.0-39.9 without comorbidity)  Congratulated her on her 16 lb weight loss  Continue eating a healthy diet  6. Nocturia  She did not go for her sleep study. She is willing to call [facility] to schedule her sleep study.    She feels this was related to drinking before bed  Patient was given opportunity to ask questions. Patient verbalized understanding of the plan and was able to repeat key elements of the plan. All questions were answered to their satisfaction.   [name], FNP   I, [name], FNP, have reviewed all documentation for this visit. The documentation on 02/12/21 for the exam, diagnosis, procedures, and orders are all accurate  and complete.   IF YOU HAVE BEEN REFERRED TO A SPECIALIST, IT MAY TAKE 1-2 WEEKS TO SCHEDULE/PROCESS THE REFERRAL. IF YOU HAVE NOT HEARD FROM US/SPECIALIST IN TWO WEEKS, PLEASE GIVE [place] A CALL AT [phone].   THE PATIENT IS ENCOURAGED TO PRACTICE SOCIAL DISTANCING DUE TO THE COVID-19 PANDEMIC.

## 2021-02-12 NOTE — Patient Instructions (Signed)

## 2021-02-12 NOTE — Progress Notes (Signed)
This visit occurred during the SARS-CoV-2 public health emergency.  Safety protocols were in place, including screening questions prior to the visit, additional usage of staff PPE, and extensive cleaning of exam room while observing appropriate contact time as indicated for disinfecting solutions.  Subjective:     Patient ID: Shirley Kent , female    DOB: [birth year] , 65 y.o.   MRN: [id number]   Chief Complaint  Patient presents with  . Hypertension    HPI  Shirley Kent presents for a hypertension follow-up.  Her blood pressure today is 142/70.  She reports home readings are about the same.  Pertinent negatives include chest pain, headaches, blurred vision, shortness of breath and dizziness.  She reports compliance with amlodipine and denies side effects.  TC, TG, and LDL were elevated at last visit.  She reports previously eating a lot of "junk" food.  She does admit to eating potato chips as her weakness. She has since started watching her diet, walking for exercise and drinking more water.  She reports compliance with rosuvastatin and denies side effects.  Her last A1C was 6.0.  She denies polydipsia and polyuria.   Hypertension This is a chronic problem. The current episode started more than 1 year ago. The problem is unchanged. The problem is uncontrolled. Pertinent negatives include no anxiety, blurred vision, chest pain, headaches, palpitations or shortness of breath. There are no associated agents to hypertension. Risk factors for coronary artery disease include obesity and sedentary lifestyle. Past treatments include calcium channel blockers. The current treatment provides mild improvement. There are no compliance problems.  There is no history of angina or kidney disease. There is no history of chronic renal disease.     Past Medical History:  Diagnosis Date  . Fibroid   . Hyperlipidemia   . Hypertension    not on meds, working on diet and exercise  . OA (osteoarthritis)  of knee    bilateral  . Prediabetes   . Vitamin D deficiency disease      Family History  Problem Relation Age of Onset  . Heart failure Mother   . Kidney disease Mother   . Multiple sclerosis Sister 22  . Breast cancer Neg Hx      Current Outpatient Medications:  .  amLODipine (NORVASC) 10 MG tablet, Take 1 tablet (10 mg total) by mouth daily., Disp: 90 tablet, Rfl: 1 .  fluticasone (FLONASE) 50 MCG/ACT nasal spray, PLACE 1 SPRAY INTO BOTH NOSTRILS DAILY., Disp: 48 mL, Rfl: 1 .  hydrocortisone (ANUSOL-HC) 25 MG suppository, Place 1 suppository (25 mg total) rectally 2 (two) times daily. For up to one week, Disp: 28 suppository, Rfl: 2 .  metFORMIN (GLUCOPHAGE) 500 MG tablet, TAKE 2 TABLETS BY MOUTH TWICE A DAY, Disp: 360 tablet, Rfl: 1 .  rosuvastatin (CRESTOR) 10 MG tablet, TAKE 1 TABLET BY MOUTH EVERY DAY, Disp: 90 tablet, Rfl: 1   No Known Allergies   Review of Systems  Constitutional: Negative.   Eyes: Negative for blurred vision.  Respiratory: Negative.  Negative for shortness of breath.   Cardiovascular: Negative.  Negative for chest pain and palpitations.  Neurological: Negative for headaches.  Psychiatric/Behavioral: Negative.      Today's Vitals   02/12/21 0834  BP: (!) 142/70  Pulse: 94  Temp: 98.1 F (36.7 C)  TempSrc: Oral  Weight: 202 lb 12.8 oz (92 kg)  Height: 5' 3.4" (1.61 m)  PainSc: 0-No pain   Body mass index is 35.47 kg/m.  Objective:  Physical Exam Cardiovascular:     Rate and Rhythm: Normal rate and regular rhythm.     Pulses: Normal pulses.  Pulmonary:     Effort: Pulmonary effort is normal.     Breath sounds: Normal breath sounds.  Neurological:     Mental Status: She is alert.  Psychiatric:        Mood and Affect: Mood normal.         Assessment And Plan:      1. Essential hypertension  Chronic, remains slightly elevated  She is advised to limit intake of potato chips  I would like to start another blood pressure  medication however the patient does not want to start one at this time. She is willing to try to cut back on the potato chips.  She will return in 8 weeks.   She is encouraged to check her blood pressure at home regularly to be sure it stays at least where it is or lower.   Explained to patient uncontrolled hypertension can cause chronic kidney disease - CMP14+EGFR  2. Mixed hyperlipidemia  Chronic, controlled  Continue with current medications, tolerating medication well - Lipid panel - CMP14+EGFR  3. Vitamin D deficiency  Will check vitamin D level and supplement as needed.     Also encouraged to spend 15 minutes in the sun daily.  - VITAMIN D 25 Hydroxy (Vit-D Deficiency, Fractures)  4. Prediabetes  Chronic, stable  Continue with current medications  Encouraged to limit intake of sugary foods and drinks  Encouraged to increase physical activity to 150 minutes per week as tolerated - Hemoglobin A1c  5. Obesity (BMI 35.0-39.9 without comorbidity)  Congratulated her on her 16 lb weight loss  Continue eating a healthy diet  6. Nocturia  She did not go for her sleep study. She is willing to call [facility] to schedule her sleep study.    She feels this was related to drinking before bed   Patient was given opportunity to ask questions. Patient verbalized understanding of the plan and was able to repeat key elements of the plan. All questions were answered to their satisfaction.   Shirley Brine, FNP   I, Shirley Brine, FNP, have reviewed all documentation for this visit. The documentation on 02/12/21 for the exam, diagnosis, procedures, and orders are all accurate and complete.   I have reviewed this encounter including the documentation in this note and/or discussed this patient with the provider. I am certifying that I agree with the content of this note as the primary care nurse practitioner.  IF YOU HAVE BEEN REFERRED TO A SPECIALIST, IT MAY TAKE 1-2 WEEKS TO  SCHEDULE/PROCESS THE REFERRAL. IF YOU HAVE NOT HEARD FROM US/SPECIALIST IN TWO WEEKS, PLEASE GIVE [place] A CALL AT [phone].   THE PATIENT IS ENCOURAGED TO PRACTICE SOCIAL DISTANCING DUE TO THE COVID-19 PANDEMIC.

## 2021-02-13 LAB — CMP14+EGFR
ALT: 11 IU/L (ref 0–32)
AST: 11 IU/L (ref 0–40)
Albumin/Globulin Ratio: 1.6 (ref 1.2–2.2)
Albumin: 4.3 g/dL (ref 3.8–4.8)
Alkaline Phosphatase: 75 IU/L (ref 44–121)
BUN/Creatinine Ratio: 15 (ref 12–28)
BUN: 13 mg/dL (ref 8–27)
Bilirubin Total: 0.3 mg/dL (ref 0.0–1.2)
CO2: 25 mmol/L (ref 20–29)
Calcium: 10.1 mg/dL (ref 8.7–10.3)
Chloride: 103 mmol/L (ref 96–106)
Creatinine, Ser: 0.85 mg/dL (ref 0.57–1.00)
Globulin, Total: 2.7 g/dL (ref 1.5–4.5)
Glucose: 94 mg/dL (ref 65–99)
Potassium: 4.3 mmol/L (ref 3.5–5.2)
Sodium: 143 mmol/L (ref 134–144)
Total Protein: 7 g/dL (ref 6.0–8.5)
eGFR: 76 mL/min/{1.73_m2} (ref >59)

## 2021-02-13 LAB — LIPID PANEL
Chol/HDL Ratio: 3.2 ratio (ref 0.0–4.4)
Cholesterol, Total: 149 mg/dL (ref 100–199)
HDL: 47 mg/dL (ref >39)
LDL Chol Calc (NIH): 80 mg/dL (ref 0–99)
Triglycerides: 123 mg/dL (ref 0–149)
VLDL Cholesterol Cal: 22 mg/dL (ref 5–40)

## 2021-02-13 LAB — VITAMIN D 25 HYDROXY (VIT D DEFICIENCY, FRACTURES): Vit D, 25-Hydroxy: 53.9 ng/mL (ref 30.0–100.0)

## 2021-02-13 LAB — HEMOGLOBIN A1C
Est. average glucose Bld gHb Est-mCnc: 123 mg/dL
Hgb A1c MFr Bld: 5.9 % — ABNORMAL HIGH (ref 4.8–5.6)

## 2021-02-21 DIAGNOSIS — J069 Acute upper respiratory infection, unspecified: Secondary | ICD-10-CM | POA: Diagnosis not present

## 2021-02-21 DIAGNOSIS — Z20822 Contact with and (suspected) exposure to covid-19: Secondary | ICD-10-CM | POA: Diagnosis not present

## 2021-03-07 ENCOUNTER — Other Ambulatory Visit: Payer: Self-pay | Admitting: Nurse Practitioner

## 2021-03-29 ENCOUNTER — Encounter: Payer: Self-pay | Admitting: Neurology

## 2021-03-29 ENCOUNTER — Ambulatory Visit: Payer: Federal, State, Local not specified - PPO | Admitting: Neurology

## 2021-03-29 VITALS — BP 175/90 | HR 100 | Ht 64.0 in | Wt 198.0 lb

## 2021-03-29 DIAGNOSIS — R351 Nocturia: Secondary | ICD-10-CM

## 2021-03-29 DIAGNOSIS — R030 Elevated blood-pressure reading, without diagnosis of hypertension: Secondary | ICD-10-CM

## 2021-03-29 DIAGNOSIS — E669 Obesity, unspecified: Secondary | ICD-10-CM | POA: Diagnosis not present

## 2021-03-29 DIAGNOSIS — R5383 Other fatigue: Secondary | ICD-10-CM

## 2021-03-29 DIAGNOSIS — R634 Abnormal weight loss: Secondary | ICD-10-CM

## 2021-03-29 NOTE — Progress Notes (Signed)
Subjective:    Patient ID: Shirley Kent is a 65 y.o. female.  HPI    Star Age, MD, PhD [facility] [address], [address] [place], [place] [address]  Dear Doreene Burke,   I saw your patient, Shirley Kent, upon your kind request in my sleep clinic today for initial consultation of her sleep disorder, in particular, concern for underlying obstructive sleep apnea.  The patient is unaccompanied today.  As you know, Shirley Kent is a 65 year old right-handed woman with an underlying medical history of hypertension, hyperlipidemia, osteoarthritis, prediabetes, vitamin D deficiency, and obesity, who reports elevated blood pressure readings especially in the mornings. She has had significant nocturia, an average of 3 times per night.  She is not aware of any snoring, lives with her husband who has not reported any snoring or apneas to her.  I reviewed your office note from 11/13/2020 as well as 02/12/2021.  She has been working on weight loss.  She estimates that she has lost about 20 pounds in the past year. Her Epworth sleepiness score is 4 out of 24, fatigue severity score is 9 out of 63.  She does not always wake up fully rested.  She goes to bed between 11 and midnight typically and rise time is generally between 9 and 10 AM.  She is retired, she used to work in [employer].  She lives with her husband and they have 2 grown children, no pets in the household, there is a TV in the bedroom and they watch it before falling asleep and she tends to be in her tablet computer to play games until 11 or 12.  She does not drink any caffeine, she does not drink alcohol and is a non-smoker.  She denies any recurrent morning headaches and is not aware of any family history of sleep apnea.  She had a tonsillectomy when she was 3.  She is reluctant to pursue sleep testing, may consider a home sleep test but is not sure she wants to proceed with testing at this time.   Her Past  Medical History Is Significant For: Past Medical History:  Diagnosis Date   Fibroid    Hyperlipidemia    Hypertension    not on meds, working on diet and exercise   OA (osteoarthritis) of knee    bilateral   Prediabetes    Vitamin D deficiency disease     Her Past Surgical History Is Significant For: Past Surgical History:  Procedure Laterality Date   ABDOMINAL HYSTERECTOMY  04/09/04   fibroids, ovaries remain   BREAST BIOPSY     BREAST REDUCTION SURGERY Bilateral 2000   BREAST SURGERY Left 1972   biopsy benign   CHOLECYSTECTOMY, LAPAROSCOPIC  04/21/11   REDUCTION MAMMAPLASTY Bilateral    TONSILLECTOMY  age 48    Her Family History Is Significant For: Family History  Problem Relation Age of Onset   Heart failure Mother    Kidney disease Mother    Multiple sclerosis Sister 1   Breast cancer Neg Hx     Her Social History Is Significant For: Social History   Socioeconomic History   Marital status: Married    Spouse name: Not on file   Number of children: 0   Years of education: Not on file   Highest education level: Not on file  Occupational History    Employer: [employer]  Tobacco Use   Smoking status: Never   Smokeless tobacco: Never  Vaping Use  Vaping Use: Never used  Substance and Sexual Activity   Alcohol use: No   Drug use: No   Sexual activity: Not Currently    Partners: Male    Birth control/protection: Surgical    Comment: hysterectomy  Other Topics Concern   Not on file  Social History Narrative   Not on file   Social Determinants of Health   Financial Resource Strain: Not on file  Food Insecurity: Not on file  Transportation Needs: Not on file  Physical Activity: Not on file  Stress: Not on file  Social Connections: Not on file    Her Allergies Are:  No Known Allergies:   Her Current Medications Are:  Outpatient Encounter Medications as of 03/29/2021  Medication Sig   amLODipine (NORVASC) 10 MG tablet Take 1 tablet (10 mg total) by  mouth daily.   fluticasone (FLONASE) 50 MCG/ACT nasal spray PLACE 1 SPRAY INTO BOTH NOSTRILS DAILY.   hydrocortisone (ANUSOL-HC) 25 MG suppository Place 1 suppository (25 mg total) rectally 2 (two) times daily. For up to one week   metFORMIN (GLUCOPHAGE) 500 MG tablet TAKE 2 TABLETS BY MOUTH TWICE A DAY   rosuvastatin (CRESTOR) 10 MG tablet TAKE 1 TABLET BY MOUTH EVERY DAY   No facility-administered encounter medications on file as of 03/29/2021.  :   Review of Systems:  Out of a complete 14 point review of systems, all are reviewed and negative with the exception of these symptoms as listed below:  Review of Systems  Neurological:        Here for sleep consult. No prior sleep study. Hx of HTN reports snoring is not present.  Epworth Sleepiness Scale 0= would never doze 1= slight chance of dozing 2= moderate chance of dozing 3= high chance of dozing  Sitting and reading:1 Watching TV:1 Sitting inactive in a public place (ex. Theater or meeting):0 As a passenger in a car for an hour without a break:2 Lying down to rest in the afternoon:0 Sitting and talking to someone:0 Sitting quietly after lunch (no alcohol):0 In a car, while stopped in traffic:0 Total:4    Objective:  Neurological Exam  Physical Exam Physical Examination:   Vitals:   03/29/21 1041  BP: (!) 175/90  Pulse: 100    General Examination: The patient is a very pleasant 65 y.o. female in no acute distress. She appears well-developed and well-nourished and well groomed.   HEENT: Normocephalic, atraumatic, pupils are equal, round and reactive to light, extraocular tracking is good without limitation to gaze excursion or nystagmus noted. Hearing is grossly intact. Face is symmetric with normal facial animation. Speech is clear with no dysarthria noted. There is no hypophonia. There is no lip, neck/head, jaw or voice tremor. Neck is supple with full range of passive and active motion. There are no carotid bruits  on auscultation. Oropharynx exam reveals: mild mouth dryness, adequate dental hygiene and mild airway crowding, due to slightly small airway entry, wider tongue noted, tonsils absent and uvula actually small, Mallampati class I.  Neck circumference of 16-1/4 inches.  She has a minimal overbite.  Tongue protrudes centrally and palate elevates symmetrically.  Chest: Clear to auscultation without wheezing, rhonchi or crackles noted.  Heart: S1+S2+0, regular and normal without murmurs, rubs or gallops noted.   Abdomen: Soft, non-tender and non-distended with normal bowel sounds appreciated on auscultation.  Extremities: There is no pitting edema in the distal lower extremities bilaterally.   Skin: Warm and dry without trophic changes noted.  Musculoskeletal: exam reveals no obvious joint deformities, tenderness or joint swelling or erythema.   Neurologically:  Mental status: The patient is awake, alert and oriented in all 4 spheres. Her immediate and remote memory, attention, language skills and fund of knowledge are appropriate. There is no evidence of aphasia, agnosia, apraxia or anomia. Speech is clear with normal prosody and enunciation. Thought process is linear. Mood is normal and affect is normal.  Cranial nerves II - XII are as described above under HEENT exam.  Motor exam: Normal bulk, strength and tone is noted. There is no tremor, Romberg is negative. Fine motor skills and coordination: grossly intact.  Cerebellar testing: No dysmetria or intention tremor. There is no truncal or gait ataxia.  Sensory exam: intact to light touch in the upper and lower extremities.  Gait, station and balance: She stands easily. No veering to one side is noted. No leaning to one side is noted. Posture is age-appropriate and stance is narrow based. Gait shows normal stride length and normal pace. No problems turning are noted. Tandem walk is unremarkable.                Assessment and plan:  In summary,  Shirley Kent is a very pleasant 65 y.o.-year old female with an underlying medical history of hypertension, hyperlipidemia, osteoarthritis, prediabetes, vitamin D deficiency, and obesity, whose history and physical exam are concerning for obstructive sleep apnea (OSA). I had a long chat with the patient about my findings and the diagnosis of OSA, its prognosis and treatment options. We talked about medical treatments, surgical interventions and non-pharmacological approaches. I explained in particular the risks and ramifications of untreated moderate to severe OSA, especially with respect to developing cardiovascular disease down the Road, including congestive heart failure, difficult to treat hypertension, cardiac arrhythmias, or stroke. Even type 2 diabetes has, in part, been linked to untreated OSA. Symptoms of untreated OSA include daytime sleepiness, memory problems, mood irritability and mood disorder such as depression and anxiety, lack of energy, as well as recurrent headaches, especially morning headaches. We talked about trying to maintain a healthy lifestyle in general, as well as the importance of weight control. We also talked about the importance of good sleep hygiene. I recommended the following at this time: sleep study.  I explained the difference between a laboratory attended sleep study versus home sleep test.  She is reluctant to pursue testing at this time but may consider a home sleep test.  We will proceed with insurance authorization and she may make a decision after that. I explained the sleep test procedure to the patient and also outlined possible surgical and non-surgical treatment options of OSA, including the use of a custom-made dental device (which would require a referral to a specialist dentist or oral surgeon), upper airway surgical options, such as traditional UPPP or a novel less invasive surgical option in the form of Inspire hypoglossal nerve stimulation (which would  involve a referral to an ENT surgeon). I also explained the positive airway pressure (PAP) treatment option to the patient.  We will pick up our discussion after testing.  I answered all her questions today and she was in agreement.  Thank you very much for allowing me to participate in the care of this nice patient. If I can be of any further assistance to you please do not hesitate to call me at [phone].  Sincerely,   Star Age, MD, PhD

## 2021-03-29 NOTE — Patient Instructions (Signed)

## 2021-04-09 ENCOUNTER — Encounter: Payer: Self-pay | Admitting: Nurse Practitioner

## 2021-04-17 ENCOUNTER — Telehealth: Payer: Self-pay

## 2021-04-17 NOTE — Telephone Encounter (Signed)
LVM for pt to call me back to schedule sleep study  

## 2021-04-18 ENCOUNTER — Other Ambulatory Visit: Payer: Self-pay | Admitting: Nurse Practitioner

## 2021-04-18 ENCOUNTER — Other Ambulatory Visit: Payer: Self-pay

## 2021-04-18 ENCOUNTER — Encounter: Payer: Self-pay | Admitting: Nurse Practitioner

## 2021-04-18 ENCOUNTER — Ambulatory Visit (INDEPENDENT_AMBULATORY_CARE_PROVIDER_SITE_OTHER): Payer: Federal, State, Local not specified - PPO | Admitting: Nurse Practitioner

## 2021-04-18 VITALS — BP 132/80 | HR 90 | Temp 98.2°F | Ht 63.6 in | Wt 199.0 lb

## 2021-04-18 DIAGNOSIS — E559 Vitamin D deficiency, unspecified: Secondary | ICD-10-CM

## 2021-04-18 DIAGNOSIS — Z23 Encounter for immunization: Secondary | ICD-10-CM

## 2021-04-18 DIAGNOSIS — Z6834 Body mass index (BMI) 34.0-34.9, adult: Secondary | ICD-10-CM

## 2021-04-18 DIAGNOSIS — I10 Essential (primary) hypertension: Secondary | ICD-10-CM | POA: Diagnosis not present

## 2021-04-18 DIAGNOSIS — E782 Mixed hyperlipidemia: Secondary | ICD-10-CM | POA: Diagnosis not present

## 2021-04-18 DIAGNOSIS — R7303 Prediabetes: Secondary | ICD-10-CM

## 2021-04-18 DIAGNOSIS — Z0000 Encounter for general adult medical examination without abnormal findings: Secondary | ICD-10-CM

## 2021-04-18 DIAGNOSIS — E6609 Other obesity due to excess calories: Secondary | ICD-10-CM

## 2021-04-18 LAB — POCT UA - MICROALBUMIN
Albumin/Creatinine Ratio, Urine, POC: <30
Creatinine, POC: 50 mg/dL
Microalbumin Ur, POC: 10 mg/L

## 2021-04-18 LAB — POCT URINALYSIS DIPSTICK
Bilirubin, UA: NEGATIVE
Glucose, UA: NEGATIVE
Ketones, UA: NEGATIVE
Leukocytes, UA: NEGATIVE
Nitrite, UA: NEGATIVE
Protein, UA: NEGATIVE
Spec Grav, UA: 1.01 (ref 1.010–1.025)
Urobilinogen, UA: 0.2 E.U./dL
pH, UA: 6.5 (ref 5.0–8.0)

## 2021-04-18 MED ORDER — ZOSTER VAC RECOMB ADJUVANTED 50 MCG/0.5ML IM SUSR: 0.5000 mL | Freq: Once | INTRAMUSCULAR | 0 refills | Status: AC

## 2021-04-18 NOTE — Progress Notes (Signed)
I,Yamilka Roman [employer] as a Education administrator for [employer], FNP.,have documented all relevant documentation on the behalf of [name], FNP,as directed by  [name], FNP while in the presence of [name], [place].  This visit occurred during the SARS-CoV-2 public health emergency.  Safety protocols were in place, including screening questions prior to the visit, additional usage of staff PPE, and extensive cleaning of exam room while observing appropriate contact time as indicated for disinfecting solutions.  Subjective:     Patient ID: Shirley Kent , female    DOB: 1956/03/26 , 65 y.o.   MRN: [id number]   Chief Complaint  Patient presents with   Annual Exam    HPI  Patient here for hm  Wt Readings from Last 3 Encounters: 04/18/21 : 199 lb (90.3 kg) 03/29/21 : 198 lb (89.8 kg) 02/12/21 : 202 lb 12.8 oz (92 kg)   She is to have an at home sleep study with Dr. Rexene Alberts.     Past Medical History:  Diagnosis Date   Fibroid    Hyperlipidemia    Hypertension    not on meds, working on diet and exercise   OA (osteoarthritis) of knee    bilateral   Prediabetes    Vitamin D deficiency disease      Family History  Problem Relation Age of Onset   Heart failure Mother    Kidney disease Mother    Multiple sclerosis Sister 60   Breast cancer Neg Hx      Current Outpatient Medications:    amLODipine (NORVASC) 10 MG tablet, Take 1 tablet (10 mg total) by mouth daily., Disp: 90 tablet, Rfl: 1   fluticasone (FLONASE) 50 MCG/ACT nasal spray, PLACE 1 SPRAY INTO BOTH NOSTRILS DAILY., Disp: 48 mL, Rfl: 1   hydrocortisone (ANUSOL-HC) 25 MG suppository, Place 1 suppository (25 mg total) rectally 2 (two) times daily. For up to one week, Disp: 28 suppository, Rfl: 2   metFORMIN (GLUCOPHAGE) 500 MG tablet, TAKE 2 TABLETS BY MOUTH TWICE A DAY, Disp: 360 tablet, Rfl: 1   rosuvastatin (CRESTOR) 10 MG tablet, TAKE 1 TABLET BY MOUTH EVERY DAY, Disp: 90 tablet, Rfl: 1   Zoster Vaccine  Adjuvanted (SHINGRIX) injection, Inject 0.5 mLs into the muscle once for 1 dose., Disp: 0.5 mL, Rfl: 0   No Known Allergies    The patient states she uses status post hysterectomy for birth control. Last LMP was Patient's last menstrual period was 03/26/2004 (approximate).. Negative for Dysmenorrhea and Negative for Menorrhagia. Negative for: breast discharge, breast lump(s), breast pain and breast self exam. Associated symptoms include abnormal vaginal bleeding. Pertinent negatives include abnormal bleeding (hematology), anxiety, decreased libido, depression, difficulty falling sleep, dyspareunia, history of infertility, nocturia, sexual dysfunction, sleep disturbances, urinary incontinence, urinary urgency, vaginal discharge and vaginal itching. Diet regular; she has been on 2 vacations and the holidays has been challenging. The patient states her exercise level is moderate - she walks a mile with a video and throughout the day will walk. She tries to drink approximately 48 oz water a day. She will eat foods in portions, she has cut out on Candy bars and limits her sweets. She does eat a good amount of fruits  The patient's tobacco use is:  Social History   Tobacco Use  Smoking Status Never  Smokeless Tobacco Never  She has been exposed to passive smoke. The patient's alcohol use is:  Social History   Substance and Sexual Activity  Alcohol Use No  Review of Systems  Constitutional: Negative.   HENT: Negative.    Eyes: Negative.   Respiratory: Negative.    Cardiovascular: Negative.   Gastrointestinal: Negative.   Endocrine: Negative.   Genitourinary: Negative.   Musculoskeletal: Negative.   Skin: Negative.   Allergic/Immunologic: Negative.   Neurological: Negative.   Hematological: Negative.   Psychiatric/Behavioral: Negative.      Today's Vitals   04/18/21 0845  BP: 132/80  Pulse: 90  Temp: 98.2 F (36.8 C)  Weight: 199 lb (90.3 kg)  Height: 5' 3.6" (1.615 m)  PainSc:  0-No pain   Body mass index is 34.59 kg/m.   Objective:  Physical Exam Constitutional:      General: She is not in acute distress.    Appearance: Normal appearance. She is well-developed. She is obese.  HENT:     Head: Normocephalic and atraumatic.     Right Ear: Hearing normal.     Left Ear: Hearing normal.     Nose:     Comments: Deferred - masked    Mouth/Throat:     Comments: Deferred - masked Eyes:     General: Lids are normal.     Extraocular Movements: Extraocular movements intact.     Conjunctiva/sclera: Conjunctivae normal.     Pupils: Pupils are equal, round, and reactive to light.     Funduscopic exam:    Right eye: No papilledema.        Left eye: No papilledema.  Neck:     Thyroid: No thyroid mass.     Vascular: No carotid bruit.  Cardiovascular:     Rate and Rhythm: Normal rate and regular rhythm.     Pulses: Normal pulses.     Heart sounds: Normal heart sounds. No murmur heard. Pulmonary:     Effort: Pulmonary effort is normal. No respiratory distress.     Breath sounds: Normal breath sounds.  Chest:  Breasts:    Tanner Score is 5.     Breasts are symmetrical.     Right: Normal. No swelling, mass, tenderness, axillary adenopathy or supraclavicular adenopathy.     Left: Normal. No swelling, mass, tenderness, axillary adenopathy or supraclavicular adenopathy.  Abdominal:     General: Abdomen is flat. Bowel sounds are normal. There is no distension.     Palpations: Abdomen is soft.     Tenderness: There is no abdominal tenderness.  Musculoskeletal:        General: No swelling. Normal range of motion.     Cervical back: Full passive range of motion without pain, normal range of motion and neck supple.     Right lower leg: No edema.     Left lower leg: No edema.  Lymphadenopathy:     Upper Body:     Right upper body: No supraclavicular or axillary adenopathy.     Left upper body: No supraclavicular or axillary adenopathy.  Skin:    General: Skin is  warm and dry.     Capillary Refill: Capillary refill takes less than 2 seconds.  Neurological:     General: No focal deficit present.     Mental Status: She is alert and oriented to person, place, and time.     Cranial Nerves: No cranial nerve deficit.     Sensory: No sensory deficit.  Psychiatric:        Mood and Affect: Mood normal.        Behavior: Behavior normal.        Thought Content: Thought content  normal.        Judgment: Judgment normal.        Assessment And Plan:     1. Encounter for general adult medical examination w/o abnormal findings Behavior modifications discussed and diet history reviewed.   Pt will continue to exercise regularly and modify diet with low GI, plant based foods and decrease intake of processed foods.  Recommend intake of daily multivitamin, Vitamin D, and calcium.  Recommend mammogram and colonoscopy (up to date) for preventive screenings, as well as recommend immunizations that include influenza, TDAP, and Shingles (she is to have her 2nd vaccine)  2. Essential hypertension B/P is controlled.  CMP ordered to check renal function.  Continue regular exercise and dietary modification was stressed to the patient.  EKG done with NSR HR 84 - POCT Urinalysis Dipstick (81002) - EKG 12-Lead  3. Mixed hyperlipidemia Chronic, controlled Continue with current medications, tolerating well  4. Vitamin D deficiency Will check vitamin D level and supplement as needed.    Also encouraged to spend 15 minutes in the sun daily.   5. Prediabetes Chronic, controlled Continue with current medications Encouraged to limit intake of sugary foods and drinks - POCT UA - Microalbumin  6. Immunization due She is due for her second Shingrix - Zoster Vaccine Adjuvanted [facility]) injection; Inject 0.5 mLs into the muscle once for 1 dose.  Dispense: 0.5 mL; Refill: 0  7. Class 1 obesity due to excess calories with body mass index (BMI) of 34.0 to 34.9 in adult,  unspecified whether serious comorbidity present Chronic, congratulated on her loss of 3 lbs and encouraged to continue what she is doing but to consider adding light strength training as tolerated Also talked about limiting carbs in the evening and limiting the amounts of fruit she eats.  Also discussed good sleep habits and the effects on weight. She is to have an at home sleep study done. She will call Dr. Guadelupe Sabin office.  Will check labs at next visit were done in May 2022  Patient was given opportunity to ask questions. Patient verbalized understanding of the plan and was able to repeat key elements of the plan. All questions were answered to their satisfaction.   [name], FNP   I, [name], FNP, have reviewed all documentation for this visit. The documentation on 04/18/21 for the exam, diagnosis, procedures, and orders are all accurate and complete.   THE PATIENT IS ENCOURAGED TO PRACTICE SOCIAL DISTANCING DUE TO THE COVID-19 PANDEMIC.

## 2021-04-18 NOTE — Patient Instructions (Signed)
Health Maintenance, Female Adopting a healthy lifestyle and getting preventive care are important in promoting health and wellness. Ask your health care provider about: The right schedule for you to have regular tests and exams. Things you can do on your own to prevent diseases and keep yourself healthy. What should I know about diet, weight, and exercise? Eat a healthy diet  Eat a diet that includes plenty of vegetables, fruits, low-fat dairy products, and lean protein. Do not eat a lot of foods that are high in solid fats, added sugars, or sodium.  Maintain a healthy weight Body mass index (BMI) is used to identify weight problems. It estimates body fat based on height and weight. Your health care provider can help determineyour BMI and help you achieve or maintain a healthy weight. Get regular exercise Get regular exercise. This is one of the most important things you can do for your health. Most adults should: Exercise for at least 150 minutes each week. The exercise should increase your heart rate and make you sweat (moderate-intensity exercise). Do strengthening exercises at least twice a week. This is in addition to the moderate-intensity exercise. Spend less time sitting. Even light physical activity can be beneficial. Watch cholesterol and blood lipids Have your blood tested for lipids and cholesterol at 65 years of age, then havethis test every 5 years. Have your cholesterol levels checked more often if: Your lipid or cholesterol levels are high. You are older than 65 years of age. You are at high risk for heart disease. What should I know about cancer screening? Depending on your health history and family history, you may need to have cancer screening at various ages. This may include screening for: Breast cancer. Cervical cancer. Colorectal cancer. Skin cancer. Lung cancer. What should I know about heart disease, diabetes, and high blood pressure? Blood pressure and heart  disease High blood pressure causes heart disease and increases the risk of stroke. This is more likely to develop in people who have high blood pressure readings, are of African descent, or are overweight. Have your blood pressure checked: Every 3-5 years if you are 18-39 years of age. Every year if you are 40 years old or older. Diabetes Have regular diabetes screenings. This checks your fasting blood sugar level. Have the screening done: Once every three years after age 40 if you are at a normal weight and have a low risk for diabetes. More often and at a younger age if you are overweight or have a high risk for diabetes. What should I know about preventing infection? Hepatitis B If you have a higher risk for hepatitis B, you should be screened for this virus. Talk with your health care provider to find out if you are at risk forhepatitis B infection. Hepatitis C Testing is recommended for: Everyone born from 1945 through 1965. Anyone with known risk factors for hepatitis C. Sexually transmitted infections (STIs) Get screened for STIs, including gonorrhea and chlamydia, if: You are sexually active and are younger than 65 years of age. You are older than 65 years of age and your health care provider tells you that you are at risk for this type of infection. Your sexual activity has changed since you were last screened, and you are at increased risk for chlamydia or gonorrhea. Ask your health care provider if you are at risk. Ask your health care provider about whether you are at high risk for HIV. Your health care provider may recommend a prescription medicine to help   prevent HIV infection. If you choose to take medicine to prevent HIV, you should first get tested for HIV. You should then be tested every 3 months for as long as you are taking the medicine. Pregnancy If you are about to stop having your period (premenopausal) and you may become pregnant, seek counseling before you get  pregnant. Take 400 to 800 micrograms (mcg) of folic acid every day if you become pregnant. Ask for birth control (contraception) if you want to prevent pregnancy. Osteoporosis and menopause Osteoporosis is a disease in which the bones lose minerals and strength with aging. This can result in bone fractures. If you are 65 years old or older, or if you are at risk for osteoporosis and fractures, ask your health care provider if you should: Be screened for bone loss. Take a calcium or vitamin D supplement to lower your risk of fractures. Be given hormone replacement therapy (HRT) to treat symptoms of menopause. Follow these instructions at home: Lifestyle Do not use any products that contain nicotine or tobacco, such as cigarettes, e-cigarettes, and chewing tobacco. If you need help quitting, ask your health care provider. Do not use street drugs. Do not share needles. Ask your health care provider for help if you need support or information about quitting drugs. Alcohol use Do not drink alcohol if: Your health care provider tells you not to drink. You are pregnant, may be pregnant, or are planning to become pregnant. If you drink alcohol: Limit how much you use to 0-1 drink a day. Limit intake if you are breastfeeding. Be aware of how much alcohol is in your drink. In the [place] one drink equals one 12 oz bottle of beer (355 mL), one 5 oz glass of wine (148 mL), or one 1 oz glass of hard liquor (44 mL). General instructions Schedule regular health, dental, and eye exams. Stay current with your vaccines. Tell your health care provider if: You often feel depressed. You have ever been abused or do not feel safe at home. Summary Adopting a healthy lifestyle and getting preventive care are important in promoting health and wellness. Follow your health care provider's instructions about healthy diet, exercising, and getting tested or screened for diseases. Follow your health care provider's  instructions on monitoring your cholesterol and blood pressure. This information is not intended to replace advice given to you by your health care provider. Make sure you discuss any questions you have with your healthcare provider. Document Revised: 09/23/2018 Document Reviewed: 09/23/2018 Elsevier Patient Education  2022 Elsevier Inc.  

## 2021-04-19 ENCOUNTER — Telehealth: Payer: Self-pay | Admitting: Neurology

## 2021-04-19 NOTE — Telephone Encounter (Signed)
Left a voice mail on 04/19/21 to schedule HST and advised patient to call back.

## 2021-05-14 ENCOUNTER — Ambulatory Visit (INDEPENDENT_AMBULATORY_CARE_PROVIDER_SITE_OTHER): Payer: Federal, State, Local not specified - PPO | Admitting: Neurology

## 2021-05-14 DIAGNOSIS — R351 Nocturia: Secondary | ICD-10-CM

## 2021-05-14 DIAGNOSIS — R030 Elevated blood-pressure reading, without diagnosis of hypertension: Secondary | ICD-10-CM

## 2021-05-14 DIAGNOSIS — G4733 Obstructive sleep apnea (adult) (pediatric): Secondary | ICD-10-CM

## 2021-05-14 DIAGNOSIS — R5383 Other fatigue: Secondary | ICD-10-CM

## 2021-05-14 DIAGNOSIS — E669 Obesity, unspecified: Secondary | ICD-10-CM

## 2021-05-14 DIAGNOSIS — R634 Abnormal weight loss: Secondary | ICD-10-CM

## 2021-05-16 NOTE — Progress Notes (Signed)
See procedure note.

## 2021-05-17 NOTE — Procedures (Signed)
   [facility]  HOME SLEEP TEST (Watch PAT) REPORT  STUDY DATE: 05/14/2021  DOB: 1955-12-09  MRN: [id number]  ORDERING CLINICIAN: Star Age, MD, PhD   REFERRING CLINICIAN: [name], FNP   CLINICAL INFORMATION/HISTORY: 65 year old right-handed woman with an underlying medical history of hypertension, hyperlipidemia, osteoarthritis, prediabetes, vitamin D deficiency, and obesity, who reports elevated blood pressure readings especially in the mornings. She has had significant nocturia, an average of 3 times per night.    Epworth sleepiness score: 4/24.  BMI: 33.9 kg/m  FINDINGS:   Sleep Summary:   Total Recording Time (hours, min): 9 hours, 54 minutes  Total Sleep Time (hours, min):  8 hours, 3 minutes   Percent REM (%):    15.8%   Respiratory Indices:   Calculated pAHI (per hour):  12.6/hour         REM pAHI:    36.1/hour       NREM pAHI: 10.6/hour  Oxygen Saturation Statistics:    Oxygen Saturation (%) Mean: 94%   Minimum oxygen saturation (%):                 89%   O2 Saturation Range (%): 89-97%    O2 Saturation (minutes) <=88%: 0 min  Pulse Rate Statistics:   Pulse Mean (bpm):    89/min    Pulse Range (76-109/min)   IMPRESSION: OSA (obstructive sleep apnea), mild  RECOMMENDATION:  This home sleep test demonstrates overall mild obstructive sleep apnea with a total AHI of 12.6/hour and O2 nadir of 89%.  Intermittent snoring was noted, in the mild-to-moderate range.  Given the patient's medical history and sleep related complaints, treatment with positive airway pressure is recommended. This can be achieved in the form of autoPAP trial/titration at home. A  full night CPAP titration study will help with proper treatment settings and mask fitting if needed. Alternative treatments may include weight loss along with avoidance of the supine sleep position, or an oral appliance in appropriate candidates. Please note that untreated  obstructive sleep apnea may carry additional perioperative morbidity. Patients with significant obstructive sleep apnea should receive perioperative PAP therapy and the surgeons and particularly the anesthesiologist should be informed of the diagnosis and the severity of the sleep disordered breathing. The patient should be cautioned not to drive, work at heights, or operate dangerous or heavy equipment when tired or sleepy. Review and reiteration of good sleep hygiene measures should be pursued with any patient. Other causes of the patient's symptoms, including circadian rhythm disturbances, an underlying mood disorder, medication effect and/or an underlying medical problem cannot be ruled out based on this test. Clinical correlation is recommended.   The patient and his referring provider will be notified of the test results. The patient will be seen in follow up in sleep clinic at [facility].  I certify that I have reviewed the raw data recording prior to the issuance of this report in accordance with the standards of the American Academy of Sleep Medicine (AASM).  INTERPRETING PHYSICIAN:   Star Age, MD, PhD  Board Certified in Neurology and Sleep Medicine  [facility] [address], [place], [place] [address] [phone]

## 2021-05-23 ENCOUNTER — Telehealth: Payer: Self-pay | Admitting: *Deleted

## 2021-05-23 NOTE — Telephone Encounter (Signed)
I called pt back.  I relayed the sleep study results :   showed obstructive sleep apnea. OSA is overall mild, but worth treating to see if she feels better after treatment and, in particular, if her blood pressure values improved over time, it may take a few months to see an improvement in that regard.  Dr. Rexene Alberts recommends treatment in the form of an AutoPap machine.   If so she would like for me to send an order to a DME company we can do this.   Alternative treatments may include weight loss and avoiding sleeping on the back.  A dental treatment with the help of a dentist can also be considered but AutoPap therapy will be the quickest and most efficient treatment.  She verbalized understanding of results.  She did not want to do autopap but wanted to proceed with weight loss, and not laying on her back. She would check with her dentist re: oral device.  She appreciated call back and will call us back as needed.

## 2021-05-23 NOTE — Telephone Encounter (Signed)
I called and LVM with office number asking for call back. If her husband Remo Lipps (on [place]) calls back, we can speak with him as well.

## 2021-05-23 NOTE — Telephone Encounter (Signed)
-----   Message from Star Age, MD sent at 05/17/2021  6:02 PM EDT ----- Patient referred by [name], NP, seen by me on 03/29/2021, patient had a home sleep test on 05/14/2021.    Please call and notify the patient that the recent home sleep test showed obstructive sleep apnea. OSA is overall mild, but worth treating to see if she feels better after treatment and, in particular, if her blood pressure values improved over time, it may take a few months to see an improvement in that regard.  If she is agreeable, I recommend treatment in the form of an AutoPap machine.  Let me know if she would like for me to send an order to a DME company.  Alternative treatments may include weight loss and avoiding sleeping on the back.  A dental treatment with the help of a dentist can also be considered but AutoPap therapy will be the quickest and most efficient treatment.  Let me know how she would like to proceed. Star Age, MD, PhD [facility])

## 2021-05-23 NOTE — Telephone Encounter (Signed)
Pt returned call. Please call back.

## 2021-05-24 ENCOUNTER — Telehealth: Payer: Self-pay | Admitting: *Deleted

## 2021-05-24 NOTE — Telephone Encounter (Signed)
-----   Message from Star Age, MD sent at 05/17/2021  6:02 PM EDT ----- Patient referred by [name], NP, seen by me on 03/29/2021, patient had a home sleep test on 05/14/2021.    Please call and notify the patient that the recent home sleep test showed obstructive sleep apnea. OSA is overall mild, but worth treating to see if she feels better after treatment and, in particular, if her blood pressure values improved over time, it may take a few months to see an improvement in that regard.  If she is agreeable, I recommend treatment in the form of an AutoPap machine.  Let me know if she would like for me to send an order to a DME company.  Alternative treatments may include weight loss and avoiding sleeping on the back.  A dental treatment with the help of a dentist can also be considered but AutoPap therapy will be the quickest and most efficient treatment.  Let me know how she would like to proceed. Star Age, MD, PhD [facility])

## 2021-05-24 NOTE — Telephone Encounter (Signed)
First message left for a return call on 05/23/21.  Second message left today on both home and mobile numbers.

## 2021-05-24 NOTE — Telephone Encounter (Signed)
I called pt and I had called her previously about her Sleep study.

## 2021-06-05 ENCOUNTER — Other Ambulatory Visit: Payer: Self-pay | Admitting: Internal Medicine

## 2021-06-05 DIAGNOSIS — E782 Mixed hyperlipidemia: Secondary | ICD-10-CM

## 2021-06-12 ENCOUNTER — Other Ambulatory Visit: Payer: Federal, State, Local not specified - PPO

## 2021-06-23 ENCOUNTER — Other Ambulatory Visit: Payer: Self-pay | Admitting: Nurse Practitioner

## 2021-06-23 DIAGNOSIS — J302 Other seasonal allergic rhinitis: Secondary | ICD-10-CM

## 2021-06-25 MED ORDER — FLUTICASONE PROPIONATE 50 MCG/ACT NA SUSP: 1.0000 | Freq: Every day | NASAL | 1 refills | Status: DC

## 2021-07-31 ENCOUNTER — Encounter: Payer: Self-pay | Admitting: Nurse Practitioner

## 2021-07-31 ENCOUNTER — Other Ambulatory Visit: Payer: Self-pay

## 2021-07-31 ENCOUNTER — Ambulatory Visit: Payer: Federal, State, Local not specified - PPO | Admitting: Nurse Practitioner

## 2021-07-31 VITALS — BP 136/80 | HR 88 | Temp 98.0°F | Ht 63.0 in | Wt 202.0 lb

## 2021-07-31 DIAGNOSIS — R7303 Prediabetes: Secondary | ICD-10-CM | POA: Diagnosis not present

## 2021-07-31 DIAGNOSIS — E782 Mixed hyperlipidemia: Secondary | ICD-10-CM | POA: Diagnosis not present

## 2021-07-31 DIAGNOSIS — I10 Essential (primary) hypertension: Secondary | ICD-10-CM

## 2021-07-31 NOTE — Progress Notes (Signed)
I, Shirley Kent,acting as a Education administrator for [employer], FNP.,have documented all relevant documentation on the behalf of Shirley Brine, FNP,as directed by  Shirley Brine, FNP while in the presence of Shirley Kent, Shirley Kent.   This visit occurred during the SARS-CoV-2 public health emergency.  Safety protocols were in place, including screening questions prior to the visit, additional usage of staff PPE, and extensive cleaning of exam room while observing appropriate contact time as indicated for disinfecting solutions.  Subjective:     Patient ID: Shirley Kent , female    DOB: June 20, 1956 , 65 y.o.   MRN: [id number]   Chief Complaint  Patient presents with   Hypertension    HPI  Pt here for HTN f/u. She is doing well today  Hypertension This is a chronic problem. The current episode started more than 1 year ago. The problem is unchanged. The problem is uncontrolled. Pertinent negatives include no anxiety, blurred vision, chest pain, headaches, palpitations or shortness of breath. There are no associated agents to hypertension. Risk factors for coronary artery disease include obesity and sedentary lifestyle. Past treatments include calcium channel blockers. The current treatment provides mild improvement. There are no compliance problems.  There is no history of angina or kidney disease. There is no history of chronic renal disease.    Past Medical History:  Diagnosis Date   Fibroid    Hyperlipidemia    Hypertension    not on meds, working on diet and exercise   OA (osteoarthritis) of knee    bilateral   Prediabetes    Vitamin D deficiency disease      Family History  Problem Relation Age of Onset   Heart failure Mother    Kidney disease Mother    Multiple sclerosis Sister 42   Breast cancer Neg Hx      Current Outpatient Medications:    amLODipine (NORVASC) 10 MG tablet, TAKE 1 TABLET BY MOUTH EVERY DAY, Disp: 90 tablet, Rfl: 1   fluticasone (FLONASE) 50 MCG/ACT nasal spray,  Place 1 spray into both nostrils daily., Disp: 48 mL, Rfl: 1   hydrocortisone (ANUSOL-HC) 25 MG suppository, Place 1 suppository (25 mg total) rectally 2 (two) times daily. For up to one week, Disp: 28 suppository, Rfl: 2   metFORMIN (GLUCOPHAGE) 500 MG tablet, TAKE 2 TABLETS BY MOUTH TWICE A DAY, Disp: 360 tablet, Rfl: 1   rosuvastatin (CRESTOR) 10 MG tablet, TAKE 1 TABLET BY MOUTH EVERY DAY, Disp: 90 tablet, Rfl: 1   No Known Allergies   Review of Systems  Constitutional: Negative.   Eyes:  Negative for blurred vision.  Respiratory: Negative.  Negative for shortness of breath.   Cardiovascular: Negative.  Negative for chest pain, palpitations and leg swelling.  Neurological: Negative.  Negative for headaches.  Psychiatric/Behavioral: Negative.      Today's Vitals   07/31/21 1032  BP: 136/80  Pulse: 88  Temp: 98 F (36.7 C)  Weight: 202 lb (91.6 kg)  Height: $Remove'5\' 3"'qtlxdUY$  (1.6 m)  PainSc: 0-No pain   Body mass index is 35.78 kg/m.  Wt Readings from Last 3 Encounters:  07/31/21 202 lb (91.6 kg)  04/18/21 199 lb (90.3 kg)  03/29/21 198 lb (89.8 kg)    Objective:  Physical Exam Constitutional:      General: She is not in acute distress.    Appearance: Normal appearance. She is obese.  Cardiovascular:     Rate and Rhythm: Normal rate and regular rhythm.     Pulses:  Normal pulses.     Heart sounds: Normal heart sounds. No murmur heard. Pulmonary:     Effort: Pulmonary effort is normal. No respiratory distress.     Breath sounds: Normal breath sounds. No wheezing.  Neurological:     General: No focal deficit present.     Mental Status: She is alert and oriented to person, place, and time.     Cranial Nerves: No cranial nerve deficit.     Motor: No weakness.  Psychiatric:        Mood and Affect: Mood normal.        Behavior: Behavior normal.        Thought Content: Thought content normal.        Judgment: Judgment normal.        Assessment And Plan:     1. Essential  hypertension Comments: Blood pressure fairly controlled. Continue current medications - CMP14+EGFR  2. Mixed hyperlipidemia Comments: Stable and tolerating statin well.  Continue current medications - Lipid panel - CMP14+EGFR  3. Prediabetes Comments: Stable, continue metformin, tolerating well . - Hemoglobin A1c    Patient was given opportunity to ask questions. Patient verbalized understanding of the plan and was able to repeat key elements of the plan. All questions were answered to their satisfaction.  Shirley Brine, FNP   I, Shirley Brine, FNP, have reviewed all documentation for this visit. The documentation on 07/31/21 for the exam, diagnosis, procedures, and orders are all accurate and complete.   IF YOU HAVE BEEN REFERRED TO A SPECIALIST, IT MAY TAKE 1-2 WEEKS TO SCHEDULE/PROCESS THE REFERRAL. IF YOU HAVE NOT HEARD FROM US/SPECIALIST IN TWO WEEKS, PLEASE GIVE [place] A CALL AT [phone].   THE PATIENT IS ENCOURAGED TO PRACTICE SOCIAL DISTANCING DUE TO THE COVID-19 PANDEMIC.

## 2021-07-31 NOTE — Patient Instructions (Signed)

## 2021-08-01 LAB — CMP14+EGFR
ALT: 12 IU/L (ref 0–32)
AST: 14 IU/L (ref 0–40)
Albumin/Globulin Ratio: 2 (ref 1.2–2.2)
Albumin: 4.9 g/dL — ABNORMAL HIGH (ref 3.8–4.8)
Alkaline Phosphatase: 78 IU/L (ref 44–121)
BUN/Creatinine Ratio: 13 (ref 12–28)
BUN: 11 mg/dL (ref 8–27)
Bilirubin Total: 0.4 mg/dL (ref 0.0–1.2)
CO2: 25 mmol/L (ref 20–29)
Calcium: 10.2 mg/dL (ref 8.7–10.3)
Chloride: 103 mmol/L (ref 96–106)
Creatinine, Ser: 0.87 mg/dL (ref 0.57–1.00)
Globulin, Total: 2.5 g/dL (ref 1.5–4.5)
Glucose: 95 mg/dL (ref 70–99)
Potassium: 4.2 mmol/L (ref 3.5–5.2)
Sodium: 143 mmol/L (ref 134–144)
Total Protein: 7.4 g/dL (ref 6.0–8.5)
eGFR: 74 mL/min/{1.73_m2} (ref >59)

## 2021-08-01 LAB — LIPID PANEL
Chol/HDL Ratio: 3.4 ratio (ref 0.0–4.4)
Cholesterol, Total: 175 mg/dL (ref 100–199)
HDL: 52 mg/dL (ref >39)
LDL Chol Calc (NIH): 99 mg/dL (ref 0–99)
Triglycerides: 137 mg/dL (ref 0–149)
VLDL Cholesterol Cal: 24 mg/dL (ref 5–40)

## 2021-08-01 LAB — HEMOGLOBIN A1C
Est. average glucose Bld gHb Est-mCnc: 123 mg/dL
Hgb A1c MFr Bld: 5.9 % — ABNORMAL HIGH (ref 4.8–5.6)

## 2021-08-30 ENCOUNTER — Other Ambulatory Visit: Payer: Self-pay | Admitting: Nurse Practitioner

## 2021-10-12 ENCOUNTER — Other Ambulatory Visit: Payer: Self-pay | Admitting: Nurse Practitioner

## 2021-10-12 DIAGNOSIS — I10 Essential (primary) hypertension: Secondary | ICD-10-CM

## 2021-10-16 ENCOUNTER — Ambulatory Visit: Payer: Federal, State, Local not specified - PPO | Admitting: Nurse Practitioner

## 2021-11-21 ENCOUNTER — Ambulatory Visit: Payer: Federal, State, Local not specified - PPO | Admitting: Nurse Practitioner

## 2021-11-28 ENCOUNTER — Other Ambulatory Visit: Payer: Self-pay | Admitting: Internal Medicine

## 2021-11-28 DIAGNOSIS — E782 Mixed hyperlipidemia: Secondary | ICD-10-CM

## 2021-12-13 NOTE — Progress Notes (Signed)
66 y.o. G1P0010 Married Black or [place] American Not Hispanic or Latino female here for annual exam.  H/O hysterectomy (ovaries retained). Not sexually active.  ?  ? ?Patient's last menstrual period was 03/26/2004 (approximate).          ?Sexually active: No.  ?The current method of family planning is status post hysterectomy.    ?Exercising: Yes.     walking ?Smoker:  no ? ?Health Maintenance: ?Pap:  2005  ?History of abnormal Pap:  no ?MMG:  12/04/20 density A Bi-rads 1 neg  ?BMD:   none  ?Colonoscopy: 2018 f/u 67yrs polyp removed ?TDaP:  10/20/19 ?Gardasil: none ? ? reports that she has never smoked. She has never used smokeless tobacco. She reports that she does not drink alcohol and does not use drugs. Retired from [employer].  ? ?Past Medical History:  ?Diagnosis Date  ? Fibroid   ? Hyperlipidemia   ? Hypertension   ? not on meds, working on diet and exercise  ? OA (osteoarthritis) of knee   ? bilateral  ? Prediabetes   ? Vitamin D deficiency disease   ? ? ?Past Surgical History:  ?Procedure Laterality Date  ? ABDOMINAL HYSTERECTOMY  04/09/04  ? fibroids, ovaries remain  ? BREAST BIOPSY    ? BREAST REDUCTION SURGERY Bilateral 2000  ? BREAST SURGERY Left 1972  ? biopsy benign  ? CHOLECYSTECTOMY, LAPAROSCOPIC  04/21/11  ? REDUCTION MAMMAPLASTY Bilateral   ? TONSILLECTOMY  age 37  ? ? ?Current Outpatient Medications  ?Medication Sig Dispense Refill  ? amLODipine (NORVASC) 10 MG tablet TAKE 1 TABLET BY MOUTH EVERY DAY 90 tablet 1  ? fluticasone (FLONASE) 50 MCG/ACT nasal spray Place 1 spray into both nostrils daily. 48 mL 1  ? hydrocortisone (ANUSOL-HC) 25 MG suppository Place 1 suppository (25 mg total) rectally 2 (two) times daily. For up to one week 28 suppository 2  ? metFORMIN (GLUCOPHAGE) 500 MG tablet TAKE 2 TABLETS BY MOUTH TWICE A DAY 360 tablet 1  ? rosuvastatin (CRESTOR) 10 MG tablet TAKE 1 TABLET BY MOUTH EVERY DAY 90 tablet 1  ? ?No current facility-administered medications for this visit.  ? ? ?Family History   ?Problem Relation Age of Onset  ? Heart failure Mother   ? Kidney disease Mother   ? Multiple sclerosis Sister 28  ? Breast cancer Neg Hx   ? ? ?Review of Systems  ?All other systems reviewed and are negative. ? ?Exam:   ?BP (!) 170/80   Pulse 72   Ht 5\' 4"  (1.626 m)   Wt 206 lb (93.4 kg)   LMP 03/26/2004 (Approximate)   SpO2 99%   BMI 35.36 kg/m?   Weight change: @WEIGHTCHANGE @ Height:   Height: 5\' 4"  (162.6 cm)  ?Ht Readings from Last 3 Encounters:  ?12/19/21 5\' 4"  (1.626 m)  ?07/31/21 5\' 3"  (1.6 m)  ?04/18/21 5' 3.6" (1.615 m)  ? ? ?General appearance: alert, cooperative and appears stated age ?Head: Normocephalic, without obvious abnormality, atraumatic ?Neck: no adenopathy, supple, symmetrical, trachea midline and thyroid normal to inspection and palpation ?Lungs: clear to auscultation bilaterally ?Cardiovascular: regular rate and rhythm ?Breasts: normal appearance, no masses or tenderness ?Abdomen: soft, non-tender; non distended,  no masses,  no organomegaly ?Extremities: extremities normal, atraumatic, no cyanosis or edema ?Skin: Skin color, texture, turgor normal. No rashes or lesions ?Lymph nodes: Cervical, supraclavicular, and axillary nodes normal. ?No abnormal inguinal nodes palpated ?Neurologic: Grossly normal ? ? ?Pelvic: External genitalia:  no lesions ?  Urethra:  normal appearing urethra with no masses, tenderness or lesions ?             Bartholins and Skenes: normal    ?             Vagina: atrophic appearing vagina with normal color and discharge, no lesions ?             Cervix: absent ?              ?Bimanual Exam:  Uterus:  uterus absent ?             Adnexa: no mass, fullness, tenderness ?              Rectovaginal: Confirms ?              Anus:  normal sphincter tone, no lesions ? ?Lovena Le, CMA chaperoned for the exam. ? ?1. Well woman exam ?Discussed breast self exam ?Discussed calcium and vit D intake ?Labs wit primary ?Mammogram overdue, she will schedule ? ?2.  Hypoestrogenism ?- DG Bone Density; Future ? ?3. Colon cancer screening ?Due in 7/23, referral placed ?- Ambulatory referral to Gastroenterology ? ? ?

## 2021-12-19 ENCOUNTER — Encounter: Payer: Self-pay | Admitting: Obstetrics and Gynecology

## 2021-12-19 ENCOUNTER — Ambulatory Visit (INDEPENDENT_AMBULATORY_CARE_PROVIDER_SITE_OTHER): Payer: Federal, State, Local not specified - PPO | Admitting: Obstetrics and Gynecology

## 2021-12-19 ENCOUNTER — Other Ambulatory Visit: Payer: Self-pay

## 2021-12-19 VITALS — BP 170/80 | HR 72 | Ht 64.0 in | Wt 206.0 lb

## 2021-12-19 DIAGNOSIS — Z01419 Encounter for gynecological examination (general) (routine) without abnormal findings: Secondary | ICD-10-CM

## 2021-12-19 DIAGNOSIS — Z1211 Encounter for screening for malignant neoplasm of colon: Secondary | ICD-10-CM

## 2021-12-19 DIAGNOSIS — E2839 Other primary ovarian failure: Secondary | ICD-10-CM

## 2021-12-19 NOTE — Patient Instructions (Signed)

## 2021-12-20 ENCOUNTER — Telehealth: Payer: Self-pay

## 2021-12-20 NOTE — Telephone Encounter (Signed)
Left pt vm to give the office a call back to confirm appointment. Scheduled for 12:20, Wednesday 12/26/2021.  ?

## 2021-12-25 ENCOUNTER — Other Ambulatory Visit: Payer: Self-pay | Admitting: Nurse Practitioner

## 2021-12-25 DIAGNOSIS — Z1231 Encounter for screening mammogram for malignant neoplasm of breast: Secondary | ICD-10-CM

## 2021-12-26 ENCOUNTER — Other Ambulatory Visit: Payer: Self-pay

## 2021-12-26 ENCOUNTER — Encounter: Payer: Self-pay | Admitting: Nurse Practitioner

## 2021-12-26 ENCOUNTER — Ambulatory Visit: Payer: Federal, State, Local not specified - PPO | Admitting: Nurse Practitioner

## 2021-12-26 VITALS — BP 130/80 | HR 105 | Temp 98.3°F | Ht 64.0 in | Wt 203.0 lb

## 2021-12-26 DIAGNOSIS — E559 Vitamin D deficiency, unspecified: Secondary | ICD-10-CM | POA: Diagnosis not present

## 2021-12-26 DIAGNOSIS — R7303 Prediabetes: Secondary | ICD-10-CM

## 2021-12-26 DIAGNOSIS — I10 Essential (primary) hypertension: Secondary | ICD-10-CM

## 2021-12-26 DIAGNOSIS — E782 Mixed hyperlipidemia: Secondary | ICD-10-CM

## 2021-12-26 DIAGNOSIS — Z2821 Immunization not carried out because of patient refusal: Secondary | ICD-10-CM

## 2021-12-26 DIAGNOSIS — E669 Obesity, unspecified: Secondary | ICD-10-CM

## 2021-12-26 MED ORDER — METFORMIN HCL 1000 MG PO TABS: 1000.0000 mg | ORAL_TABLET | Freq: Two times a day (BID) | ORAL | 1 refills | Status: DC

## 2021-12-26 NOTE — Progress Notes (Signed)
?Judithann Sauger [place],acting as a Education administrator for [name], FNP.,have documented all relevant documentation on the behalf of [name], FNP,as directed by  [name], FNP while in the presence of [name], [place].  ? ?This visit occurred during the SARS-CoV-2 public health emergency.  Safety protocols were in place, including screening questions prior to the visit, additional usage of staff PPE, and extensive cleaning of exam room while observing appropriate contact time as indicated for disinfecting solutions. ? ?Subjective:  ?  ? Patient ID: Shirley Kent , female    DOB: [birth year] , 66 y.o.   MRN: [id number] ? ? ?Chief Complaint  ?Patient presents with  ? Hypertension  ? ? ?HPI ? ?Patient presents today for a bp check. She reports compliance with her meds. She has no questions or concerns at this time ? ?Wt Readings from Last 3 Encounters: ?12/26/21 : 203 lb (92.1 kg) ?12/19/21 : 206 lb (93.4 kg) ?07/31/21 : 202 lb (91.6 kg) ? .  ? ?Hypertension ?This is a chronic problem. The current episode started more than 1 year ago. The problem is unchanged. The problem is uncontrolled. Pertinent negatives include no anxiety, blurred vision, chest pain, headaches, palpitations or shortness of breath. There are no associated agents to hypertension. Risk factors for coronary artery disease include obesity and sedentary lifestyle. Past treatments include calcium channel blockers. The current treatment provides mild improvement. There are no compliance problems.  There is no history of angina or kidney disease. There is no history of chronic renal disease.   ? ?Past Medical History:  ?Diagnosis Date  ? Fibroid   ? Hyperlipidemia   ? Hypertension   ? not on meds, working on diet and exercise  ? OA (osteoarthritis) of knee   ? bilateral  ? Prediabetes   ? Vitamin D deficiency disease   ?  ? ?Family History  ?Problem Relation Age of Onset  ? Heart failure Mother   ? Kidney disease Mother   ? Multiple sclerosis Sister  43  ? Breast cancer Neg Hx   ? ? ? ?Current Outpatient Medications:  ?  amLODipine (NORVASC) 10 MG tablet, TAKE 1 TABLET BY MOUTH EVERY DAY, Disp: 90 tablet, Rfl: 1 ?  fluticasone (FLONASE) 50 MCG/ACT nasal spray, Place 1 spray into both nostrils daily., Disp: 48 mL, Rfl: 1 ?  hydrocortisone (ANUSOL-HC) 25 MG suppository, Place 1 suppository (25 mg total) rectally 2 (two) times daily. For up to one week, Disp: 28 suppository, Rfl: 2 ?  rosuvastatin (CRESTOR) 10 MG tablet, TAKE 1 TABLET BY MOUTH EVERY DAY, Disp: 90 tablet, Rfl: 1 ?  metFORMIN (GLUCOPHAGE) 1000 MG tablet, Take 1 tablet (1,000 mg total) by mouth 2 (two) times daily with a meal., Disp: 160 tablet, Rfl: 1  ? ?No Known Allergies  ? ?Review of Systems  ?Eyes:  Negative for blurred vision.  ?Respiratory:  Negative for shortness of breath.   ?Cardiovascular:  Negative for chest pain and palpitations.  ?Neurological:  Negative for headaches.   ? ?Today's Vitals  ? 12/26/21 1157  ?BP: 130/80  ?Pulse: (!) 105  ?Temp: 98.3 ?F (36.8 ?C)  ?Weight: 203 lb (92.1 kg)  ?Height: '5\' 4"'$  (1.626 m)  ?PainSc: 0-No pain  ? ?Body mass index is 34.84 kg/m?.  ? ?Objective:  ?Physical Exam ?Constitutional:   ?   General: She is not in acute distress. ?   Appearance: Normal appearance. She is obese.  ?Cardiovascular:  ?   Rate and Rhythm: Normal  rate and regular rhythm.  ?   Pulses: Normal pulses.  ?   Heart sounds: Normal heart sounds. No murmur heard. ?Pulmonary:  ?   Effort: Pulmonary effort is normal. No respiratory distress.  ?   Breath sounds: Normal breath sounds. No wheezing.  ?Skin: ?   Capillary Refill: Capillary refill takes less than 2 seconds.  ?Neurological:  ?   General: No focal deficit present.  ?   Mental Status: She is alert and oriented to person, place, and time.  ?   Cranial Nerves: No cranial nerve deficit.  ?   Motor: No weakness.  ?Psychiatric:     ?   Mood and Affect: Mood normal.     ?   Behavior: Behavior normal.     ?   Thought Content: Thought  content normal.     ?   Judgment: Judgment normal.  ?  ? ?   ?Assessment And Plan:  ?   ?1. Essential hypertension ?Comments: Blood pressure is fairly controlled, this is better since her visit to GYN.  Continue current medications  ? ?2. Mixed hyperlipidemia ?Comments: Stable, continue statin, tolerating well.  ? ?3. Prediabetes ?Comments: Will change her metformin to the 1000 mg tablet two times, no labs this visit ?- metFORMIN (GLUCOPHAGE) 1000 MG tablet; Take 1 tablet (1,000 mg total) by mouth 2 (two) times daily with a meal.  Dispense: 160 tablet; Refill: 1 ? ?4. Vitamin D deficiency ?Will check vitamin D level and supplement as needed.    ?Also encouraged to spend 15 minutes in the sun daily.  ? ?5. Obesity (BMI 35.0-39.9 without comorbidity) ?Comments: Encouraged to continue exercising regularly and healthy diet. ? ?6. Pneumococcal vaccination declined ?  ? ? ?Patient was given opportunity to ask questions. Patient verbalized understanding of the plan and was able to repeat key elements of the plan. All questions were answered to their satisfaction.  ?[name], FNP  ? ?I, [name], FNP, have reviewed all documentation for this visit. The documentation on 12/26/21 for the exam, diagnosis, procedures, and orders are all accurate and complete.  ? ?IF YOU HAVE BEEN REFERRED TO A SPECIALIST, IT MAY TAKE 1-2 WEEKS TO SCHEDULE/PROCESS THE REFERRAL. IF YOU HAVE NOT HEARD FROM US/SPECIALIST IN TWO WEEKS, PLEASE GIVE [place] A CALL AT [phone].  ? ?THE PATIENT IS ENCOURAGED TO PRACTICE SOCIAL DISTANCING DUE TO THE COVID-19 PANDEMIC.   ?

## 2021-12-26 NOTE — Patient Instructions (Signed)

## 2021-12-28 ENCOUNTER — Ambulatory Visit
Admission: RE | Admit: 2021-12-28 | Discharge: 2021-12-28 | Disposition: A | Discharge status: Home / Self Care | Payer: Federal, State, Local not specified - PPO | Source: Ambulatory Visit | Attending: Nurse Practitioner | Admitting: Nurse Practitioner

## 2021-12-28 ENCOUNTER — Other Ambulatory Visit: Payer: Self-pay

## 2021-12-28 DIAGNOSIS — Z1231 Encounter for screening mammogram for malignant neoplasm of breast: Secondary | ICD-10-CM

## 2022-02-28 ENCOUNTER — Other Ambulatory Visit: Payer: Self-pay | Admitting: Nurse Practitioner

## 2022-04-13 ENCOUNTER — Other Ambulatory Visit: Payer: Self-pay | Admitting: Nurse Practitioner

## 2022-04-13 DIAGNOSIS — I10 Essential (primary) hypertension: Secondary | ICD-10-CM

## 2022-04-29 ENCOUNTER — Encounter: Payer: Self-pay | Admitting: Nurse Practitioner

## 2022-04-29 ENCOUNTER — Ambulatory Visit (INDEPENDENT_AMBULATORY_CARE_PROVIDER_SITE_OTHER): Payer: Federal, State, Local not specified - PPO | Admitting: Nurse Practitioner

## 2022-04-29 VITALS — BP 140/60 | HR 87 | Temp 98.2°F | Ht 64.0 in | Wt 206.6 lb

## 2022-04-29 DIAGNOSIS — E669 Obesity, unspecified: Secondary | ICD-10-CM

## 2022-04-29 DIAGNOSIS — E782 Mixed hyperlipidemia: Secondary | ICD-10-CM

## 2022-04-29 DIAGNOSIS — E559 Vitamin D deficiency, unspecified: Secondary | ICD-10-CM | POA: Diagnosis not present

## 2022-04-29 DIAGNOSIS — Z6835 Body mass index (BMI) 35.0-35.9, adult: Secondary | ICD-10-CM

## 2022-04-29 DIAGNOSIS — Z0000 Encounter for general adult medical examination without abnormal findings: Secondary | ICD-10-CM | POA: Diagnosis not present

## 2022-04-29 DIAGNOSIS — R7303 Prediabetes: Secondary | ICD-10-CM | POA: Diagnosis not present

## 2022-04-29 DIAGNOSIS — I10 Essential (primary) hypertension: Secondary | ICD-10-CM | POA: Diagnosis not present

## 2022-04-29 DIAGNOSIS — Z79899 Other long term (current) drug therapy: Secondary | ICD-10-CM

## 2022-04-29 NOTE — Progress Notes (Signed)
Barnet Glasgow Martin,acting as a Education administrator for [name], FNP.,have documented all relevant documentation on the behalf of [name], FNP,as directed by  [name], FNP while in the presence of [name], [place].   Subjective:     Patient ID: Shirley Kent , female    DOB: Jan 16, 1956 , 66 y.o.   MRN: [id number]   Chief Complaint  Patient presents with   Annual Exam    HPI  Here for HM.       Past Medical History:  Diagnosis Date   Fibroid    Hyperlipidemia    Hypertension    not on meds, working on diet and exercise   OA (osteoarthritis) of knee    bilateral   Prediabetes    Vitamin D deficiency disease      Family History  Problem Relation Age of Onset   Heart failure Mother    Kidney disease Mother    Multiple sclerosis Sister 24   Breast cancer Neg Hx      Current Outpatient Medications:    amLODipine (NORVASC) 10 MG tablet, TAKE 1 TABLET BY MOUTH EVERY DAY, Disp: 90 tablet, Rfl: 1   fluticasone (FLONASE) 50 MCG/ACT nasal spray, Place 1 spray into both nostrils daily., Disp: 48 mL, Rfl: 1   hydrocortisone (ANUSOL-HC) 25 MG suppository, Place 1 suppository (25 mg total) rectally 2 (two) times daily. For up to one week, Disp: 28 suppository, Rfl: 2   metFORMIN (GLUCOPHAGE) 1000 MG tablet, Take 1 tablet (1,000 mg total) by mouth 2 (two) times daily with a meal., Disp: 160 tablet, Rfl: 1   rosuvastatin (CRESTOR) 10 MG tablet, TAKE 1 TABLET BY MOUTH EVERY DAY, Disp: 90 tablet, Rfl: 1   No Known Allergies    The patient states she is status post hysterectomy.   Negative for: breast discharge, breast lump(s), breast pain and breast self exam. Associated symptoms include abnormal vaginal bleeding. Pertinent negatives include abnormal bleeding (hematology), anxiety, decreased libido, depression, difficulty falling sleep, dyspareunia, history of infertility, nocturia, sexual dysfunction, sleep disturbances, urinary incontinence, urinary urgency, vaginal discharge and  vaginal itching. Diet regular.  The patient states her exercise level is moderate with walking daily  The patient's tobacco use is:  Social History   Tobacco Use  Smoking Status Never  Smokeless Tobacco Never   She has been exposed to passive smoke. The patient's alcohol use is:  Social History   Substance and Sexual Activity  Alcohol Use No    Review of Systems  Constitutional: Negative.   HENT: Negative.    Eyes: Negative.   Respiratory: Negative.    Cardiovascular:  Positive for chest pain (feels like something is pulling to left side of chest, radiating to front of shoulder. When she is walking does not bother her but when sitting for a long time will have the "pulling" feeling. After taking 2 tylenol will resolve). Negative for palpitations.  Gastrointestinal: Negative.   Endocrine: Negative.   Genitourinary: Negative.   Musculoskeletal: Negative.   Skin: Negative.   Allergic/Immunologic: Negative.   Neurological: Negative.   Hematological: Negative.   Psychiatric/Behavioral: Negative.       Today's Vitals   04/29/22 0845  BP: 140/60  Pulse: 87  Temp: 98.2 F (36.8 C)  TempSrc: Oral  Weight: 206 lb 9.6 oz (93.7 kg)  Height: _0  (1.626 m)  PainSc: 0-No pain   Body mass index is 35.46 kg/m.   Objective:  Physical Exam Vitals reviewed.  Constitutional:  General: She is not in acute distress.    Appearance: Normal appearance. She is well-developed. She is obese.  HENT:     Head: Normocephalic and atraumatic.     Right Ear: Hearing, tympanic membrane, ear canal and external ear normal. There is no impacted cerumen.     Left Ear: Hearing, tympanic membrane, ear canal and external ear normal. There is no impacted cerumen.     Nose: Nose normal.     Mouth/Throat:     Mouth: Mucous membranes are moist.  Eyes:     General: Lids are normal.     Extraocular Movements: Extraocular movements intact.     Conjunctiva/sclera: Conjunctivae normal.     Pupils:  Pupils are equal, round, and reactive to light.     Funduscopic exam:    Right eye: No papilledema.        Left eye: No papilledema.  Neck:     Thyroid: No thyroid mass.     Vascular: No carotid bruit.  Cardiovascular:     Rate and Rhythm: Normal rate and regular rhythm.     Pulses: Normal pulses.     Heart sounds: Normal heart sounds. No murmur heard. Pulmonary:     Effort: Pulmonary effort is normal. No respiratory distress.     Breath sounds: Normal breath sounds. No wheezing.  Chest:  Breasts:    Tanner Score is 5.     Breasts are symmetrical.     Right: Normal. No swelling, mass or tenderness.     Left: Normal. No swelling, mass or tenderness.  Abdominal:     General: Abdomen is flat. Bowel sounds are normal. There is no distension.     Palpations: Abdomen is soft.     Tenderness: There is no abdominal tenderness.  Musculoskeletal:        General: No swelling or tenderness. Normal range of motion.     Cervical back: Full passive range of motion without pain, normal range of motion and neck supple.     Right lower leg: No edema.     Left lower leg: No edema.  Lymphadenopathy:     Upper Body:     Right upper body: No supraclavicular or axillary adenopathy.     Left upper body: No supraclavicular or axillary adenopathy.  Skin:    General: Skin is warm and dry.     Capillary Refill: Capillary refill takes less than 2 seconds.  Neurological:     General: No focal deficit present.     Mental Status: She is alert and oriented to person, place, and time.     Cranial Nerves: No cranial nerve deficit.     Sensory: No sensory deficit.  Psychiatric:        Mood and Affect: Mood normal.        Behavior: Behavior normal.        Thought Content: Thought content normal.        Judgment: Judgment normal.         Assessment And Plan:     1. Annual physical exam Behavior modifications discussed and diet history reviewed.   Pt will continue to exercise regularly and modify diet  with low GI, plant based foods and decrease intake of processed foods.  Recommend intake of daily multivitamin, Vitamin D, and calcium.  Recommend mammogram and colonoscopy (up to date) for preventive screenings, as well as recommend immunizations that include influenza, TDAP, and Shingles  2. Primary hypertension Comments: Blood pressure is slightly elevated  systolic, encouraged to limit intake of salt. EKG done with NSR HR 90 - EKG 12-Lead - CMP14+EGFR - Hemoglobin A1c - POCT Urinalysis Dipstick (81002) - Microalbumin / Creatinine Urine Ratio  3. Vitamin D deficiency Will check vitamin D level and supplement as needed.    Also encouraged to spend 15 minutes in the sun daily.  - Vitamin D (25 hydroxy)  4. Prediabetes Comments: Stable, diet controlled. Continue regular exercise  5. Mixed hyperlipidemia Comments: Stable, continue current medications (statin) tolerating well and low fat diet - Lipid panel  6. Morbid obesity ([place]) She is encouraged to strive for BMI less than 30 to decrease cardiac risk. Advised to aim for at least 150 minutes of exercise per week as tolerated.  7. BMI 35.0-35.9,adult  8. Other long term (current) drug therapy - CBC no Diff   Patient was given opportunity to ask questions. Patient verbalized understanding of the plan and was able to repeat key elements of the plan. All questions were answered to their satisfaction.   [name], FNP    I, [name], FNP, have reviewed all documentation for this visit. The documentation on 04/29/22 for the exam, diagnosis, procedures, and orders are all accurate and complete.   THE PATIENT IS ENCOURAGED TO PRACTICE SOCIAL DISTANCING DUE TO THE COVID-19 PANDEMIC.

## 2022-04-29 NOTE — Patient Instructions (Signed)

## 2022-04-30 LAB — CMP14+EGFR
ALT: 11 IU/L (ref 0–32)
AST: 13 IU/L (ref 0–40)
Albumin/Globulin Ratio: 1.8 (ref 1.2–2.2)
Albumin: 4.6 g/dL (ref 3.9–4.9)
Alkaline Phosphatase: 85 IU/L (ref 44–121)
BUN/Creatinine Ratio: 10 — ABNORMAL LOW (ref 12–28)
BUN: 9 mg/dL (ref 8–27)
Bilirubin Total: 0.4 mg/dL (ref 0.0–1.2)
CO2: 25 mmol/L (ref 20–29)
Calcium: 9.9 mg/dL (ref 8.7–10.3)
Chloride: 102 mmol/L (ref 96–106)
Creatinine, Ser: 0.89 mg/dL (ref 0.57–1.00)
Globulin, Total: 2.6 g/dL (ref 1.5–4.5)
Glucose: 101 mg/dL — ABNORMAL HIGH (ref 70–99)
Potassium: 4.3 mmol/L (ref 3.5–5.2)
Sodium: 141 mmol/L (ref 134–144)
Total Protein: 7.2 g/dL (ref 6.0–8.5)
eGFR: 72 mL/min/{1.73_m2} (ref >59)

## 2022-04-30 LAB — LIPID PANEL
Chol/HDL Ratio: 3.6 ratio (ref 0.0–4.4)
Cholesterol, Total: 189 mg/dL (ref 100–199)
HDL: 52 mg/dL (ref >39)
LDL Chol Calc (NIH): 113 mg/dL — ABNORMAL HIGH (ref 0–99)
Triglycerides: 135 mg/dL (ref 0–149)
VLDL Cholesterol Cal: 24 mg/dL (ref 5–40)

## 2022-04-30 LAB — MICROALBUMIN / CREATININE URINE RATIO
Creatinine, Urine: 49.4 mg/dL
Microalb/Creat Ratio: <6 mg/g creat (ref 0–29)
Microalbumin, Urine: <3 ug/mL

## 2022-04-30 LAB — HEMOGLOBIN A1C
Est. average glucose Bld gHb Est-mCnc: 120 mg/dL
Hgb A1c MFr Bld: 5.8 % — ABNORMAL HIGH (ref 4.8–5.6)

## 2022-04-30 LAB — CBC
Hematocrit: 39.6 % (ref 34.0–46.6)
Hemoglobin: 12.5 g/dL (ref 11.1–15.9)
MCH: 28.5 pg (ref 26.6–33.0)
MCHC: 31.6 g/dL (ref 31.5–35.7)
MCV: 90 fL (ref 79–97)
Platelets: 324 10*3/uL (ref 150–450)
RBC: 4.39 x10E6/uL (ref 3.77–5.28)
RDW: 13.1 % (ref 11.7–15.4)
WBC: 5.5 10*3/uL (ref 3.4–10.8)

## 2022-04-30 LAB — VITAMIN D 25 HYDROXY (VIT D DEFICIENCY, FRACTURES): Vit D, 25-Hydroxy: 43.9 ng/mL (ref 30.0–100.0)

## 2022-05-27 ENCOUNTER — Other Ambulatory Visit: Payer: Self-pay | Admitting: Internal Medicine

## 2022-05-27 DIAGNOSIS — E782 Mixed hyperlipidemia: Secondary | ICD-10-CM

## 2022-06-02 ENCOUNTER — Other Ambulatory Visit: Payer: Self-pay | Admitting: Nurse Practitioner

## 2022-06-02 DIAGNOSIS — R7303 Prediabetes: Secondary | ICD-10-CM

## 2022-07-01 ENCOUNTER — Other Ambulatory Visit: Payer: Self-pay | Admitting: Nurse Practitioner

## 2022-07-01 DIAGNOSIS — J302 Other seasonal allergic rhinitis: Secondary | ICD-10-CM

## 2022-07-01 MED ORDER — FLUTICASONE PROPIONATE 50 MCG/ACT NA SUSP: 1.0000 | Freq: Every day | NASAL | 1 refills | Status: DC

## 2022-10-12 ENCOUNTER — Other Ambulatory Visit: Payer: Self-pay | Admitting: Nurse Practitioner

## 2022-10-12 DIAGNOSIS — I10 Essential (primary) hypertension: Secondary | ICD-10-CM

## 2022-11-07 ENCOUNTER — Other Ambulatory Visit: Payer: Self-pay | Admitting: Nurse Practitioner

## 2022-11-07 DIAGNOSIS — R7303 Prediabetes: Secondary | ICD-10-CM

## 2022-11-21 ENCOUNTER — Other Ambulatory Visit: Payer: Self-pay | Admitting: Internal Medicine

## 2022-11-21 DIAGNOSIS — E782 Mixed hyperlipidemia: Secondary | ICD-10-CM

## 2023-01-10 IMAGING — MG MM DIGITAL SCREENING BILAT W/ TOMO AND CAD
8 series · 8 of 24 positions shown · non-contrast
Comparison: Previous exam(s).

CLINICAL DATA: Screening.

EXAM:
DIGITAL SCREENING BILATERAL MAMMOGRAM WITH TOMOSYNTHESIS AND CAD
TECHNIQUE: Bilateral screening digital craniocaudal and mediolateral oblique
mammograms were obtained. Bilateral screening digital breast
tomosynthesis was performed. The images were evaluated with
computer-aided detection.

[R MLO synth-2D]
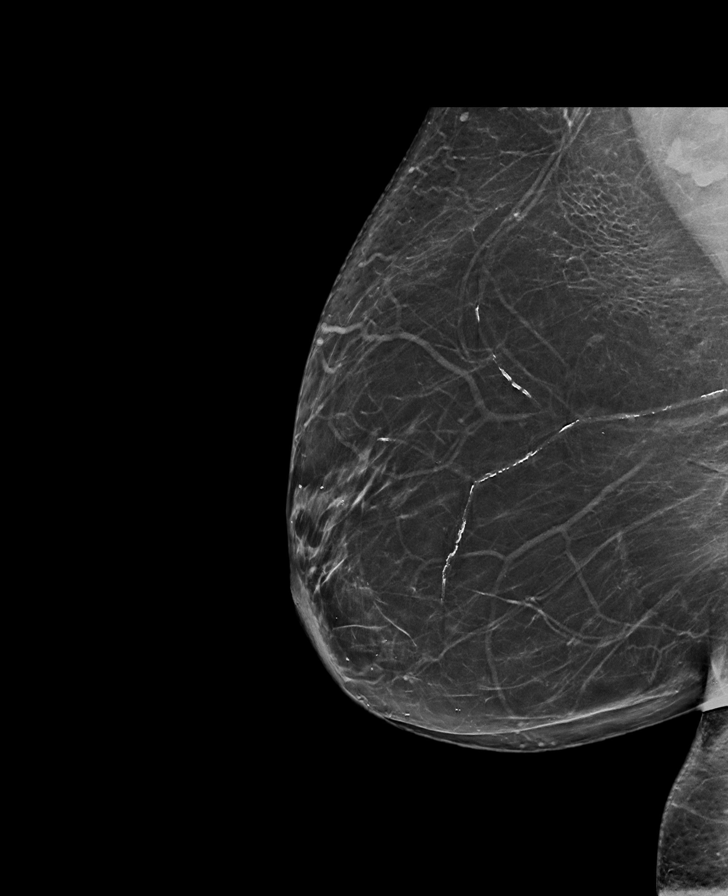

[L MLO synth-2D]
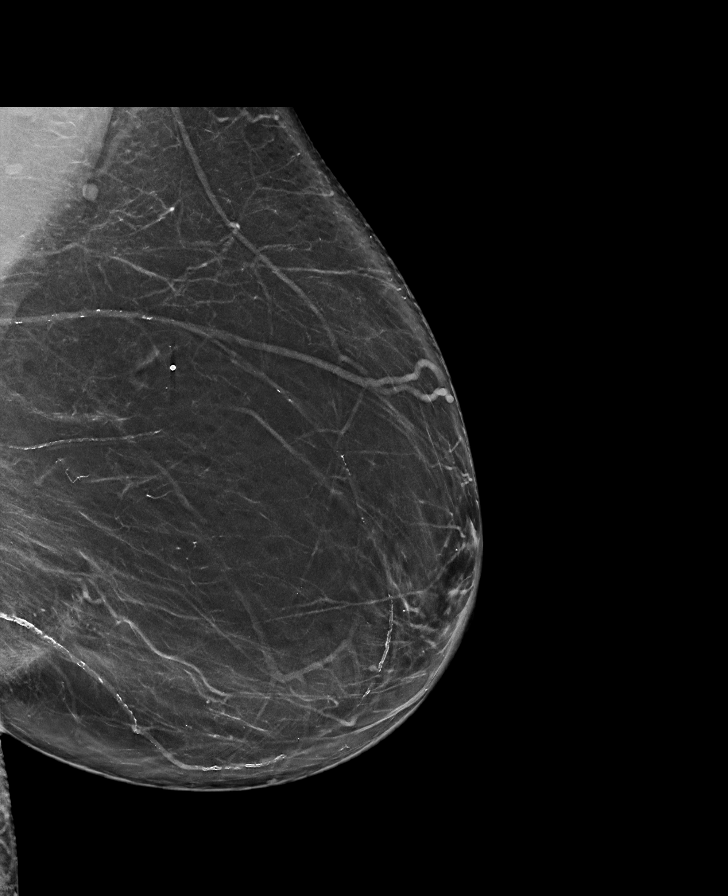

[R CC synth-2D]
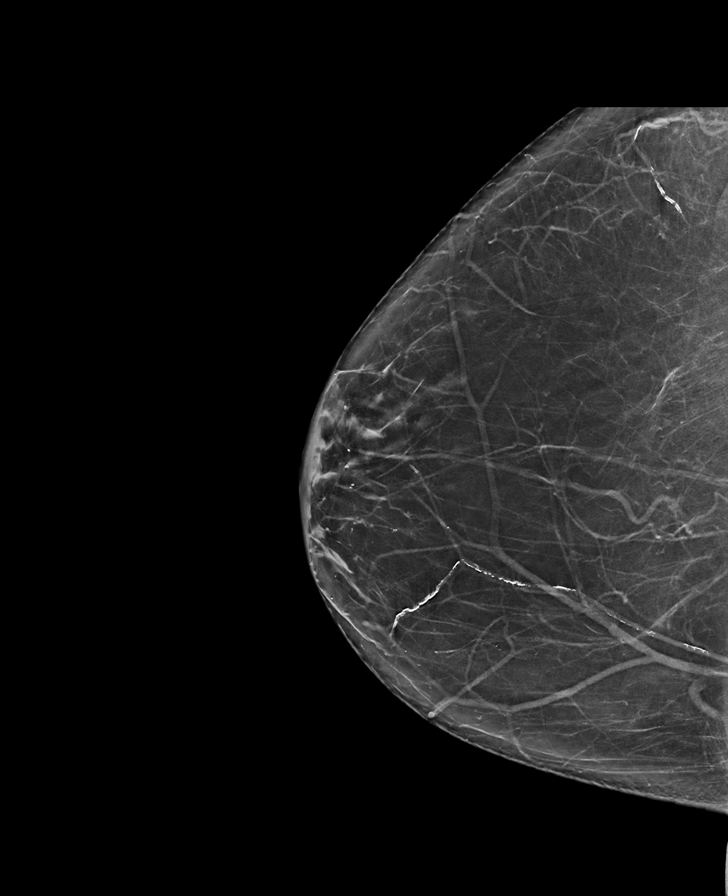

[L CC synth-2D]
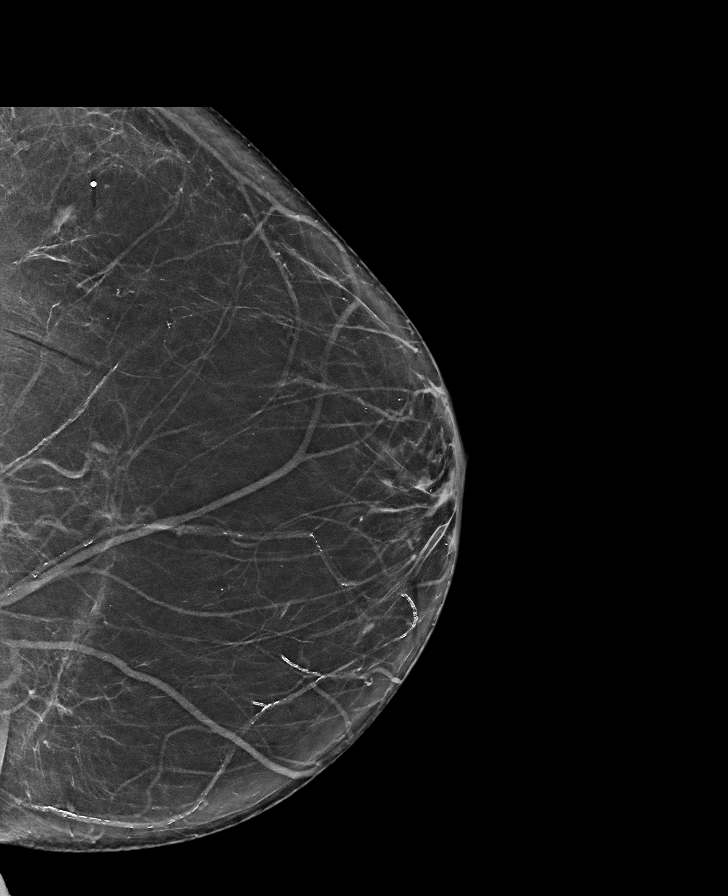

[L CC tomo · tomo slice 39/76.0]
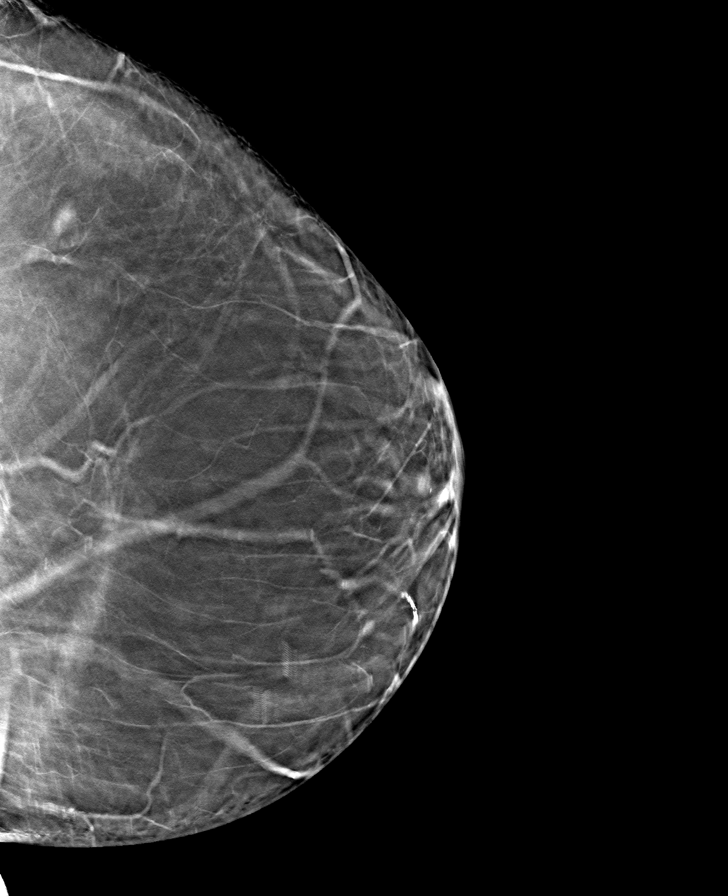

[L MLO tomo · tomo slice 43/84.0]
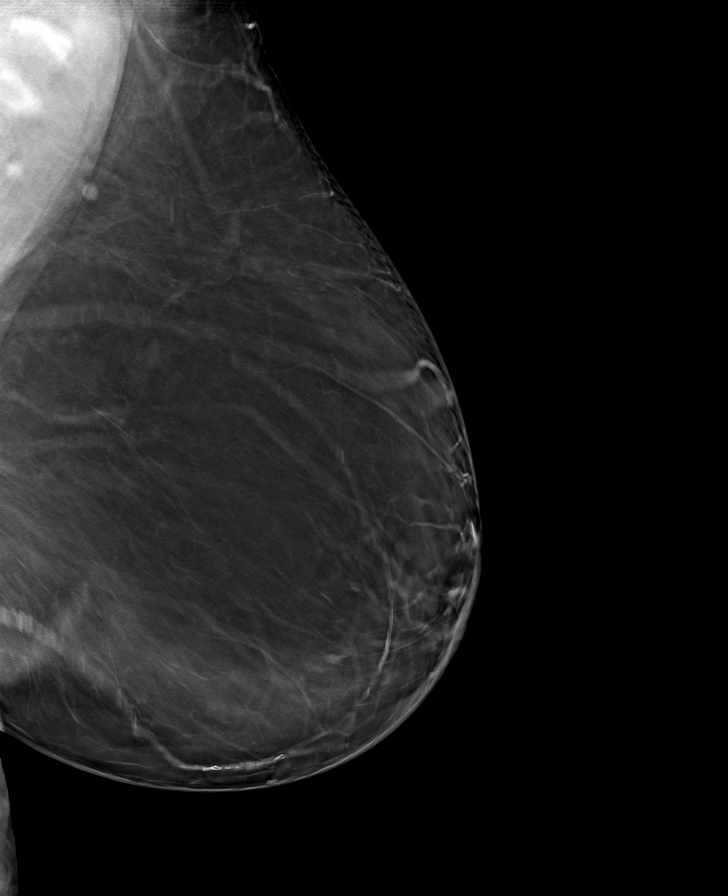

[R MLO tomo · tomo slice 40/79.0]
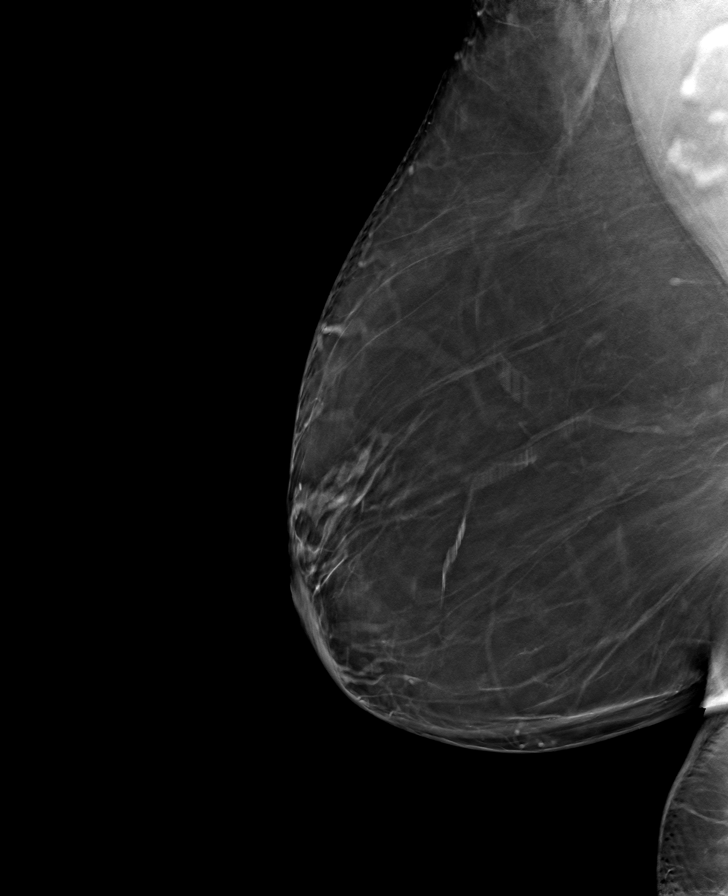

[R CC tomo · tomo slice 37/74.0]
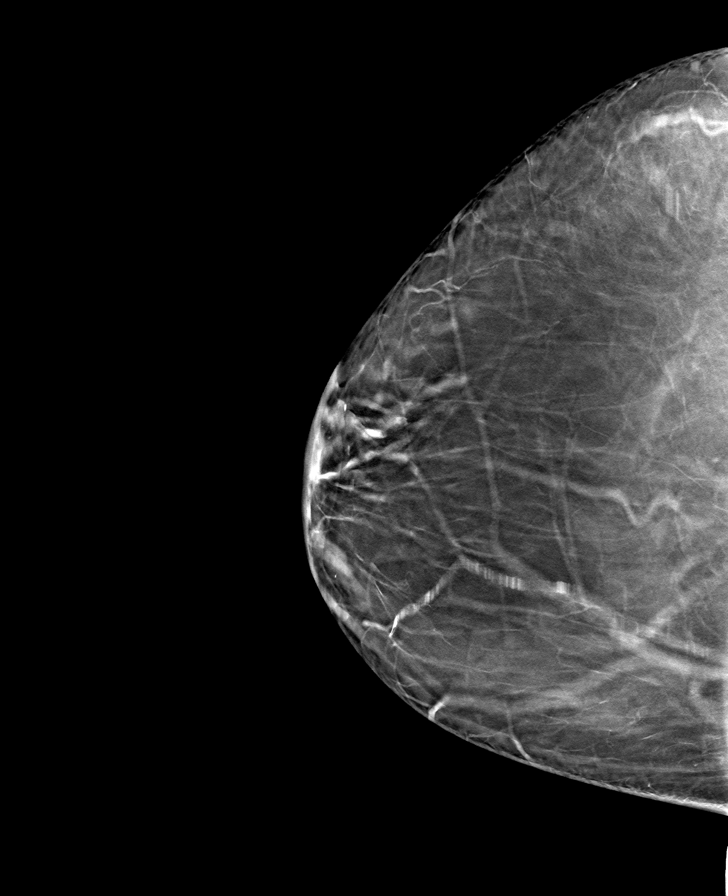

[8 of 24 positions shown; findings below may reference images not displayed]

ACR Breast Density Category b: There are scattered areas of
fibroglandular density.
FINDINGS: There are no findings suspicious for malignancy.
IMPRESSION: No mammographic evidence of malignancy. A result letter of this
screening mammogram will be mailed directly to the patient.

RECOMMENDATION:
Screening mammogram in one year. (Code:51-O-LD2)

BI-RADS CATEGORY  1: Negative.

## 2023-01-28 ENCOUNTER — Other Ambulatory Visit: Payer: Self-pay | Admitting: Nurse Practitioner

## 2023-01-28 DIAGNOSIS — Z1231 Encounter for screening mammogram for malignant neoplasm of breast: Secondary | ICD-10-CM

## 2023-03-12 ENCOUNTER — Ambulatory Visit
Admission: RE | Admit: 2023-03-12 | Discharge: 2023-03-12 | Disposition: A | Discharge status: Home / Self Care | Payer: Federal, State, Local not specified - PPO | Source: Ambulatory Visit | Attending: Nurse Practitioner | Admitting: Nurse Practitioner

## 2023-03-12 DIAGNOSIS — Z1231 Encounter for screening mammogram for malignant neoplasm of breast: Secondary | ICD-10-CM

## 2023-04-03 ENCOUNTER — Other Ambulatory Visit: Payer: Self-pay | Admitting: Nurse Practitioner

## 2023-04-03 DIAGNOSIS — I10 Essential (primary) hypertension: Secondary | ICD-10-CM

## 2023-04-03 DIAGNOSIS — E782 Mixed hyperlipidemia: Secondary | ICD-10-CM

## 2023-05-13 ENCOUNTER — Encounter: Payer: Self-pay | Admitting: Nurse Practitioner

## 2023-05-13 ENCOUNTER — Ambulatory Visit (INDEPENDENT_AMBULATORY_CARE_PROVIDER_SITE_OTHER): Payer: Federal, State, Local not specified - PPO | Admitting: Nurse Practitioner

## 2023-05-13 VITALS — BP 130/70 | HR 104 | Temp 98.8°F | Wt 202.0 lb

## 2023-05-13 DIAGNOSIS — R7303 Prediabetes: Secondary | ICD-10-CM

## 2023-05-13 DIAGNOSIS — E559 Vitamin D deficiency, unspecified: Secondary | ICD-10-CM

## 2023-05-13 DIAGNOSIS — E782 Mixed hyperlipidemia: Secondary | ICD-10-CM

## 2023-05-13 DIAGNOSIS — I10 Essential (primary) hypertension: Secondary | ICD-10-CM

## 2023-05-13 DIAGNOSIS — Z0000 Encounter for general adult medical examination without abnormal findings: Secondary | ICD-10-CM | POA: Insufficient documentation

## 2023-05-13 DIAGNOSIS — Z23 Encounter for immunization: Secondary | ICD-10-CM | POA: Insufficient documentation

## 2023-05-13 DIAGNOSIS — E2839 Other primary ovarian failure: Secondary | ICD-10-CM

## 2023-05-13 DIAGNOSIS — Z2821 Immunization not carried out because of patient refusal: Secondary | ICD-10-CM

## 2023-05-13 DIAGNOSIS — Z79899 Other long term (current) drug therapy: Secondary | ICD-10-CM

## 2023-05-13 LAB — POCT URINALYSIS DIP (CLINITEK)
Bilirubin, UA: NEGATIVE
Glucose, UA: NEGATIVE mg/dL
Ketones, POC UA: NEGATIVE mg/dL
Leukocytes, UA: NEGATIVE
Nitrite, UA: NEGATIVE
POC PROTEIN,UA: NEGATIVE
Spec Grav, UA: 1.02 (ref 1.010–1.025)
Urobilinogen, UA: 1 E.U./dL
pH, UA: 6 (ref 5.0–8.0)

## 2023-05-13 NOTE — Progress Notes (Signed)
Madelaine Bhat, CMA,acting as a Neurosurgeon for Arnette Felts, FNP.,have documented all relevant documentation on the behalf of Arnette Felts, FNP,as directed by  Arnette Felts, FNP while in the presence of Arnette Felts, FNP.  Subjective:    Patient ID: Shirley Kent , female    DOB: 17-Jul-1956 , 67 y.o.   MRN: [id number]  Chief Complaint  Patient presents with   Annual Exam    HPI  Patient presents today for HM, patient reports compliance with medications. Patient denies any chest pain, SOB, or headaches. Patient has no concerns today.  BP Readings from Last 3 Encounters: 05/13/23 : (!) 140/70 04/29/22 : 140/60 12/26/21 : 130/80       Past Medical History:  Diagnosis Date   Fibroid    Hyperlipidemia    Hypertension    not on meds, working on diet and exercise   OA (osteoarthritis) of knee    bilateral   Prediabetes    Vitamin D deficiency disease      Family History  Problem Relation Age of Onset   Heart failure Mother    Kidney disease Mother    ADD / ADHD Mother    Heart disease Mother    Multiple sclerosis Sister 66   Breast cancer Neg Hx      Current Outpatient Medications:    amLODipine (NORVASC) 10 MG tablet, TAKE 1 TABLET BY MOUTH EVERY DAY, Disp: 90 tablet, Rfl: 1   fluticasone (FLONASE) 50 MCG/ACT nasal spray, Place 1 spray into both nostrils daily., Disp: 48 mL, Rfl: 1   hydrocortisone (ANUSOL-HC) 25 MG suppository, Place 1 suppository (25 mg total) rectally 2 (two) times daily. For up to one week, Disp: 28 suppository, Rfl: 2   metFORMIN (GLUCOPHAGE) 1000 MG tablet, TAKE 1 TABLET (1,000 MG TOTAL) BY MOUTH TWICE A DAY WITH FOOD, Disp: 320 tablet, Rfl: 1   rosuvastatin (CRESTOR) 10 MG tablet, TAKE 1 TABLET BY MOUTH EVERY DAY, Disp: 90 tablet, Rfl: 1   No Known Allergies    The patient states she is status post hysterectomy.  Negative for: breast discharge, breast lump(s), breast pain and breast self exam. Associated symptoms include abnormal vaginal  bleeding. Pertinent negatives include abnormal bleeding (hematology), anxiety, decreased libido, depression, difficulty falling sleep, dyspareunia, history of infertility, nocturia, sexual dysfunction, sleep disturbances, urinary incontinence, urinary urgency, vaginal discharge and vaginal itching. Diet regular. The patient states her exercise level is minimal due to her mother is living with her with alzheimers so she is not walking as much.   The patient's tobacco use is:  Social History   Tobacco Use  Smoking Status Never  Smokeless Tobacco Never  Tobacco Comments   never smoked   She has been exposed to passive smoke. The patient's alcohol use is:  Social History   Substance and Sexual Activity  Alcohol Use Never    Review of Systems  Constitutional: Negative.   HENT: Negative.    Eyes: Negative.   Respiratory: Negative.    Cardiovascular: Negative.   Gastrointestinal: Negative.   Endocrine: Negative.   Genitourinary: Negative.   Musculoskeletal: Negative.   Skin: Negative.   Allergic/Immunologic: Negative.   Neurological: Negative.   Hematological: Negative.   Psychiatric/Behavioral: Negative.       Today's Vitals   05/13/23 0846 05/13/23 1040  BP: (!) 140/70 130/70  Pulse: (!) 104   Temp: 98.8 F (37.1 C)   TempSrc: Oral   Weight: 202 lb (91.6 kg)   PainSc: 0-No pain  Body mass index is 34.67 kg/m.  Wt Readings from Last 3 Encounters:  05/13/23 202 lb (91.6 kg)  04/29/22 206 lb 9.6 oz (93.7 kg)  12/26/21 203 lb (92.1 kg)     Objective:  Physical Exam Vitals reviewed.  Constitutional:      General: She is not in acute distress.    Appearance: Normal appearance. She is well-developed. She is obese.  HENT:     Head: Normocephalic and atraumatic.     Right Ear: Hearing, tympanic membrane, ear canal and external ear normal. There is no impacted cerumen.     Left Ear: Hearing, tympanic membrane, ear canal and external ear normal. There is no impacted  cerumen.     Nose: Nose normal.     Mouth/Throat:     Mouth: Mucous membranes are moist.  Eyes:     General: Lids are normal.     Extraocular Movements: Extraocular movements intact.     Conjunctiva/sclera: Conjunctivae normal.     Pupils: Pupils are equal, round, and reactive to light.     Funduscopic exam:    Right eye: No papilledema.        Left eye: No papilledema.  Neck:     Thyroid: No thyroid mass.     Vascular: No carotid bruit.  Cardiovascular:     Rate and Rhythm: Normal rate and regular rhythm.     Pulses: Normal pulses.     Heart sounds: Normal heart sounds. No murmur heard. Pulmonary:     Effort: Pulmonary effort is normal. No respiratory distress.     Breath sounds: Normal breath sounds. No wheezing.  Chest:  Breasts:    Tanner Score is 5.     Breasts are symmetrical.     Right: Normal. No swelling, mass or tenderness.     Left: Normal. No swelling, mass or tenderness.  Abdominal:     General: Abdomen is flat. Bowel sounds are normal. There is no distension.     Palpations: Abdomen is soft.     Tenderness: There is no abdominal tenderness.  Musculoskeletal:        General: No swelling or tenderness. Normal range of motion.     Cervical back: Full passive range of motion without pain, normal range of motion and neck supple.     Right lower leg: No edema.     Left lower leg: No edema.  Lymphadenopathy:     Upper Body:     Right upper body: No supraclavicular or axillary adenopathy.     Left upper body: No supraclavicular or axillary adenopathy.  Skin:    General: Skin is warm and dry.     Capillary Refill: Capillary refill takes less than 2 seconds.  Neurological:     General: No focal deficit present.     Mental Status: She is alert and oriented to person, place, and time.     Cranial Nerves: No cranial nerve deficit.     Sensory: No sensory deficit.  Psychiatric:        Mood and Affect: Mood normal.        Behavior: Behavior normal.        Thought  Content: Thought content normal.        Judgment: Judgment normal.         Assessment And Plan:     Encounter for annual health examination Assessment & Plan: Behavior modifications discussed and diet history reviewed.   Pt will continue to exercise regularly and modify diet  with low GI, plant based foods and decrease intake of processed foods.  Recommend intake of daily multivitamin, Vitamin D, and calcium.  Recommend mammogram and colonoscopy for preventive screenings, as well as recommend immunizations that include influenza, TDAP, and Shingles    Primary hypertension Assessment & Plan: Blood pressure is controlled, was slightly elevated with repeat improved, continue current medications. EKG done NSR HR 97  Orders: -     Microalbumin / creatinine urine ratio -     POCT URINALYSIS DIP (CLINITEK) -     EKG 12-Lead -     CMP14+EGFR  Vitamin D deficiency Assessment & Plan: Will check vitamin D level and supplement as needed.    Also encouraged to spend 15 minutes in the sun daily.    Orders: -     VITAMIN D 25 Hydroxy (Vit-D Deficiency, Fractures)  Prediabetes Assessment & Plan: HgbA1c is stable. Continue focusing on diet low in sugar and starches.   Orders: -     Hemoglobin A1c  Mixed hyperlipidemia Assessment & Plan: LDL was slightly elevated at last visit will recheck lipid panel.   Orders: -     Lipid panel  Decreased estrogen level -     DG Bone Density; Future  COVID-19 vaccination declined Assessment & Plan: Declines covid 19 vaccine. Discussed risk of covid 69 and if she changes her mind about the vaccine to call the office. Education has been provided regarding the importance of this vaccine but patient still declined. Advised may receive this vaccine at local pharmacy or Health Dept.or vaccine clinic. Aware to provide a copy of the vaccination record if obtained from local pharmacy or Health Dept.  Encouraged to take multivitamin, vitamin d, vitamin c  and zinc to increase immune system. Aware can call office if would like to have vaccine here at office. Verbalized acceptance and understanding.    Encounter for Prevnar pneumococcal vaccination Assessment & Plan: Given in office  Orders: -     Pneumococcal conjugate vaccine 20-valent  Other long term (current) drug therapy -     CBC with Differential/Platelet     Return for 1 year physical, 4 month bp check. Patient was given opportunity to ask questions. Patient verbalized understanding of the plan and was able to repeat key elements of the plan. All questions were answered to their satisfaction.   Arnette Felts, FNP  I, Arnette Felts, FNP, have reviewed all documentation for this visit. The documentation on 05/20/23 for the exam, diagnosis, procedures, and orders are all accurate and complete.

## 2023-05-20 NOTE — Assessment & Plan Note (Signed)
Behavior modifications discussed and diet history reviewed.   Pt will continue to exercise regularly and modify diet with low GI, plant based foods and decrease intake of processed foods.  Recommend intake of daily multivitamin, Vitamin D, and calcium.  Recommend mammogram and colonoscopy for preventive screenings, as well as recommend immunizations that include influenza, TDAP, and Shingles  

## 2023-05-20 NOTE — Assessment & Plan Note (Signed)
LDL was slightly elevated at last visit will recheck lipid panel.

## 2023-05-20 NOTE — Assessment & Plan Note (Signed)
HgbA1c is stable. Continue focusing on diet low in sugar and starches.

## 2023-05-20 NOTE — Assessment & Plan Note (Signed)
Given in office

## 2023-05-20 NOTE — Assessment & Plan Note (Signed)
Will check vitamin D level and supplement as needed.    Also encouraged to spend 15 minutes in the sun daily.   

## 2023-05-20 NOTE — Assessment & Plan Note (Signed)

## 2023-05-20 NOTE — Assessment & Plan Note (Addendum)
Blood pressure is controlled, was slightly elevated with repeat improved, continue current medications. EKG done NSR HR 97

## 2023-06-23 ENCOUNTER — Encounter: Payer: Self-pay | Admitting: Nurse Practitioner

## 2023-07-30 ENCOUNTER — Other Ambulatory Visit: Payer: Self-pay | Admitting: Nurse Practitioner

## 2023-07-30 DIAGNOSIS — R7303 Prediabetes: Secondary | ICD-10-CM

## 2023-08-25 ENCOUNTER — Other Ambulatory Visit: Payer: Self-pay | Admitting: Nurse Practitioner

## 2023-08-25 DIAGNOSIS — J302 Other seasonal allergic rhinitis: Secondary | ICD-10-CM

## 2023-09-15 ENCOUNTER — Other Ambulatory Visit: Payer: Self-pay | Admitting: Nurse Practitioner

## 2023-09-15 ENCOUNTER — Encounter: Payer: Self-pay | Admitting: Nurse Practitioner

## 2023-09-15 ENCOUNTER — Ambulatory Visit: Payer: Federal, State, Local not specified - PPO | Admitting: Nurse Practitioner

## 2023-09-15 VITALS — BP 140/78 | HR 99 | Temp 98.4°F | Ht 64.0 in | Wt 209.0 lb

## 2023-09-15 DIAGNOSIS — I10 Essential (primary) hypertension: Secondary | ICD-10-CM | POA: Diagnosis not present

## 2023-09-15 DIAGNOSIS — E782 Mixed hyperlipidemia: Secondary | ICD-10-CM | POA: Diagnosis not present

## 2023-09-15 DIAGNOSIS — E669 Obesity, unspecified: Secondary | ICD-10-CM | POA: Diagnosis not present

## 2023-09-15 DIAGNOSIS — R7303 Prediabetes: Secondary | ICD-10-CM

## 2023-09-15 DIAGNOSIS — Z2821 Immunization not carried out because of patient refusal: Secondary | ICD-10-CM

## 2023-09-15 NOTE — Patient Instructions (Signed)
You need to take HBP [place] for cold symptoms due to having a history of hypertension.

## 2023-09-15 NOTE — Progress Notes (Unsigned)
Madelaine Bhat, CMA,acting as a Neurosurgeon for Arnette Felts, FNP.,have documented all relevant documentation on the behalf of Arnette Felts, FNP,as directed by  Arnette Felts, FNP while in the presence of Arnette Felts, FNP.  Subjective:  Patient ID: Shirley Kent , female    DOB: Oct 27, 1955 , 67 y.o.   MRN: [id number]  Chief Complaint  Patient presents with   Hypertension    HPI  Patient presents today for a bp,chol, and dm follow up, Patient reports compliance with medication. Patient denies any chest pain, SOB, or headaches. Patient has no concerns today. She took her medications this morning. She did not eat ham but had [place] and fish over the weekend. She is exercising 2-3 days a week. She has not had much interest in exercising. She reports having cold symptoms for the last week and is now clearing up. She has not checked for covid.      Past Medical History:  Diagnosis Date   Fibroid    Hyperlipidemia    Hypertension    not on meds, working on diet and exercise   OA (osteoarthritis) of knee    bilateral   Prediabetes    Vitamin D deficiency disease      Family History  Problem Relation Age of Onset   Heart failure Mother    Kidney disease Mother    ADD / ADHD Mother    Heart disease Mother    Multiple sclerosis Sister 28   Breast cancer Neg Hx      Current Outpatient Medications:    amLODipine (NORVASC) 10 MG tablet, TAKE 1 TABLET BY MOUTH EVERY DAY, Disp: 90 tablet, Rfl: 1   fluticasone (FLONASE) 50 MCG/ACT nasal spray, SPRAY 1 SPRAY INTO BOTH NOSTRILS DAILY., Disp: 16 mL, Rfl: 5   hydrocortisone (ANUSOL-HC) 25 MG suppository, Place 1 suppository (25 mg total) rectally 2 (two) times daily. For up to one week, Disp: 28 suppository, Rfl: 2   metFORMIN (GLUCOPHAGE) 1000 MG tablet, TAKE 1 TABLET (1,000 MG TOTAL) BY MOUTH TWICE A DAY WITH FOOD, Disp: 180 tablet, Rfl: 3   rosuvastatin (CRESTOR) 10 MG tablet, TAKE 1 TABLET BY MOUTH EVERY DAY, Disp: 90 tablet, Rfl: 1   No  Known Allergies   Review of Systems  Constitutional: Negative.   HENT: Negative.    Eyes: Negative.   Respiratory: Negative.    Cardiovascular: Negative.   Gastrointestinal: Negative.   Neurological: Negative.   Psychiatric/Behavioral: Negative.       Today's Vitals   09/15/23 1123 09/15/23 1204  BP: (!) 160/80 (!) 140/78  Pulse: 99   Temp: 98.4 F (36.9 C)   Weight: 209 lb (94.8 kg)   Height: 5\' 4"  (1.626 m)   PainSc: 0-No pain    Body mass index is 35.87 kg/m.  Wt Readings from Last 3 Encounters:  09/15/23 209 lb (94.8 kg)  05/13/23 202 lb (91.6 kg)  04/29/22 206 lb 9.6 oz (93.7 kg)     Objective:  Physical Exam Vitals reviewed.  Constitutional:      General: She is not in acute distress.    Appearance: Normal appearance. She is obese.  HENT:     Mouth/Throat:     Mouth: Mucous membranes are moist.  Cardiovascular:     Rate and Rhythm: Normal rate and regular rhythm.     Pulses: Normal pulses.     Heart sounds: Normal heart sounds. No murmur heard. Pulmonary:     Effort: Pulmonary effort is normal. No  respiratory distress.     Breath sounds: Normal breath sounds. No wheezing.  Skin:    General: Skin is warm.     Capillary Refill: Capillary refill takes less than 2 seconds.  Neurological:     General: No focal deficit present.     Mental Status: She is alert and oriented to person, place, and time.     Cranial Nerves: No cranial nerve deficit.     Motor: No weakness.  Psychiatric:        Mood and Affect: Mood normal.        Behavior: Behavior normal.        Thought Content: Thought content normal.        Judgment: Judgment normal.         Assessment And Plan:  Primary hypertension Assessment & Plan: Blood pressure is elevated, repeat improved slightly, continue current medications. She is to focus on lifestyle modifications  Orders: -     Basic metabolic panel  Mixed hyperlipidemia Assessment & Plan: LDL was slightly elevated at last visit  will recheck lipid panel.   Orders: -     Lipid panel  Prediabetes Assessment & Plan: HgbA1c is stable. Continue focusing on diet low in sugar and starches.   Orders: -     Hemoglobin A1c  COVID-19 vaccination declined Assessment & Plan: Declines covid 19 vaccine. Discussed risk of covid 43 and if she changes her mind about the vaccine to call the office. Education has been provided regarding the importance of this vaccine but patient still declined. Advised may receive this vaccine at local pharmacy or Health Dept.or vaccine clinic. Aware to provide a copy of the vaccination record if obtained from local pharmacy or Health Dept.  Encouraged to take multivitamin, vitamin d, vitamin c and zinc to increase immune system. Aware can call office if would like to have vaccine here at office. Verbalized acceptance and understanding.    Obesity (BMI 35.0-39.9 without comorbidity) Assessment & Plan: She is encouraged to strive for BMI less than 30 to decrease cardiac risk. Advised to aim for at least 150 minutes of exercise per week.      Return in about 4 months (around 01/14/2024) for BPC; 2 week [place] BP check.   Patient was given opportunity to ask questions. Patient verbalized understanding of the plan and was able to repeat key elements of the plan. All questions were answered to their satisfaction.   Jeanell Sparrow, FNP, have reviewed all documentation for this visit. The documentation on 09/15/23 for the exam, diagnosis, procedures, and orders are all accurate and complete.   IF YOU HAVE BEEN REFERRED TO A SPECIALIST, IT MAY TAKE 1-2 WEEKS TO SCHEDULE/PROCESS THE REFERRAL. IF YOU HAVE NOT HEARD FROM US/SPECIALIST IN TWO WEEKS, PLEASE GIVE [place] A CALL AT [phone].

## 2023-09-16 LAB — LIPID PANEL
Chol/HDL Ratio: 3.1 {ratio} (ref 0.0–4.4)
Cholesterol, Total: 171 mg/dL (ref 100–199)
HDL: 56 mg/dL (ref >39)
LDL Chol Calc (NIH): 100 mg/dL — ABNORMAL HIGH (ref 0–99)
Triglycerides: 78 mg/dL (ref 0–149)
VLDL Cholesterol Cal: 15 mg/dL (ref 5–40)

## 2023-09-16 LAB — BASIC METABOLIC PANEL
BUN/Creatinine Ratio: 16 (ref 12–28)
BUN: 14 mg/dL (ref 8–27)
CO2: 25 mmol/L (ref 20–29)
Calcium: 9.8 mg/dL (ref 8.7–10.3)
Chloride: 104 mmol/L (ref 96–106)
Creatinine, Ser: 0.89 mg/dL (ref 0.57–1.00)
Glucose: 89 mg/dL (ref 70–99)
Potassium: 4.5 mmol/L (ref 3.5–5.2)
Sodium: 144 mmol/L (ref 134–144)
eGFR: 71 mL/min/{1.73_m2} (ref >59)

## 2023-09-16 LAB — HEMOGLOBIN A1C
Est. average glucose Bld gHb Est-mCnc: 128 mg/dL
Hgb A1c MFr Bld: 6.1 % — ABNORMAL HIGH (ref 4.8–5.6)

## 2023-09-18 NOTE — Assessment & Plan Note (Signed)
HgbA1c is stable. Continue focusing on diet low in sugar and starches.

## 2023-09-18 NOTE — Assessment & Plan Note (Signed)
Blood pressure is elevated, repeat improved slightly, continue current medications. She is to focus on lifestyle modifications

## 2023-09-18 NOTE — Assessment & Plan Note (Signed)
 She is encouraged to strive for BMI less than 30 to decrease cardiac risk. Advised to aim for at least 150 minutes of exercise per week.

## 2023-09-18 NOTE — Assessment & Plan Note (Signed)
LDL was slightly elevated at last visit will recheck lipid panel.

## 2023-09-18 NOTE — Assessment & Plan Note (Signed)

## 2023-09-30 ENCOUNTER — Ambulatory Visit: Payer: Federal, State, Local not specified - PPO

## 2023-10-01 ENCOUNTER — Other Ambulatory Visit: Payer: Self-pay | Admitting: Nurse Practitioner

## 2023-10-01 DIAGNOSIS — I10 Essential (primary) hypertension: Secondary | ICD-10-CM

## 2023-11-05 ENCOUNTER — Other Ambulatory Visit: Payer: Self-pay | Admitting: Nurse Practitioner

## 2023-11-05 DIAGNOSIS — E782 Mixed hyperlipidemia: Secondary | ICD-10-CM

## 2023-12-29 ENCOUNTER — Encounter: Payer: Self-pay | Admitting: Nurse Practitioner

## 2024-01-02 ENCOUNTER — Ambulatory Visit: Admitting: Obstetrics and Gynecology

## 2024-01-02 ENCOUNTER — Encounter: Payer: Self-pay | Admitting: Obstetrics and Gynecology

## 2024-01-02 VITALS — BP 122/72 | HR 110

## 2024-01-02 DIAGNOSIS — R300 Dysuria: Secondary | ICD-10-CM | POA: Diagnosis not present

## 2024-01-02 DIAGNOSIS — B3731 Acute candidiasis of vulva and vagina: Secondary | ICD-10-CM

## 2024-01-02 DIAGNOSIS — N898 Other specified noninflammatory disorders of vagina: Secondary | ICD-10-CM

## 2024-01-02 LAB — WET PREP FOR TRICH, YEAST, CLUE

## 2024-01-02 MED ORDER — NITROFURANTOIN MONOHYD MACRO 100 MG PO CAPS: 100.0000 mg | ORAL_CAPSULE | Freq: Two times a day (BID) | ORAL | 0 refills | Status: DC

## 2024-01-02 MED ORDER — METRONIDAZOLE 500 MG PO TABS: 500.0000 mg | ORAL_TABLET | Freq: Two times a day (BID) | ORAL | 0 refills | Status: DC

## 2024-01-02 MED ORDER — FLUCONAZOLE 150 MG PO TABS: 150.0000 mg | ORAL_TABLET | Freq: Once | ORAL | 0 refills | Status: AC

## 2024-01-02 NOTE — Addendum Note (Signed)
 Addended by: Earley Favor on: 01/02/2024 12:33 PM   Modules accepted: Orders

## 2024-01-02 NOTE — Progress Notes (Signed)
   Acute Office Visit  Subjective:    Patient ID: Shirley Kent, female    DOB: 12/06/1955, 68 y.o.   MRN: [id number]   HPI 68 y.o. presents today for Office Visit (Patient states itching inside of vagina and burning and stinging with urination. Frequency with urination, back pain and pelvic pain, no discharge, no smell. Used vagistat 3 day treatment, prebiotic probiotic vaginal suppositories, and drank cranberry juice. Says this helped slightly but not much. Started this week around Sunday/Monday. ) .  Patient's last menstrual period was 03/26/2004 (approximate).    Review of Systems     Objective:    OBGyn Exam Sve: no lesions, obvious yeast infection  LMP 03/26/2004 (Approximate)  Wt Readings from Last 3 Encounters:  09/15/23 209 lb (94.8 kg)  05/13/23 202 lb (91.6 kg)  04/29/22 206 lb 9.6 oz (93.7 kg)        Patient informed chaperone available to be present for breast and/or pelvic exam. Patient has requested no chaperone to be present. Patient has been advised what will be completed during breast and pelvic exam.   Assessment & Plan:  Dysruia, vaginal pruritus, UTI, yeast infection  To take diflucan one tablet now and repeat after finishing macrobid RTC with any concerns Dr. Judith Blonder

## 2024-01-02 NOTE — Patient Instructions (Signed)
 Hi [place] The vaginal culture showed bacterial vaginosis but not yeast.  I am going to treat for the yeast, based on exam findings and symptoms.  I sent over macrobid for a UTI, flagyl for BV treatment and diflucan for yeast.  If there are any questions, please let me know. Hope you are feeling better. Please do not drink any alcohol with the medications.  Dr. Karma Greaser

## 2024-01-05 ENCOUNTER — Encounter: Payer: Self-pay | Admitting: Obstetrics and Gynecology

## 2024-01-05 DIAGNOSIS — Z8719 Personal history of other diseases of the digestive system: Secondary | ICD-10-CM

## 2024-01-05 LAB — URINE CULTURE
MICRO NUMBER:: 16231836
SPECIMEN QUALITY:: ADEQUATE

## 2024-01-05 LAB — URINALYSIS, COMPLETE W/RFL CULTURE
Bilirubin Urine: NEGATIVE
Glucose, UA: NEGATIVE
Hyaline Cast: NONE SEEN /LPF
Ketones, ur: NEGATIVE
Nitrites, Initial: NEGATIVE
Protein, ur: NEGATIVE
Specific Gravity, Urine: 1.015 (ref 1.001–1.035)
pH: 7 (ref 5.0–8.0)

## 2024-01-05 LAB — CULTURE INDICATED

## 2024-01-08 MED ORDER — HYDROCORTISONE ACETATE 25 MG RE SUPP: 25.0000 mg | Freq: Two times a day (BID) | RECTAL | 2 refills | Status: AC

## 2024-01-08 NOTE — Telephone Encounter (Signed)
 Last AEX 12/19/2021-JJ, nothing scheduled.  Rx pend.

## 2024-01-12 ENCOUNTER — Encounter: Payer: Self-pay | Admitting: Obstetrics and Gynecology

## 2024-01-12 ENCOUNTER — Ambulatory Visit
Admission: RE | Admit: 2024-01-12 | Discharge: 2024-01-12 | Disposition: A | Discharge status: Home / Self Care | Payer: Federal, State, Local not specified - PPO | Source: Ambulatory Visit | Attending: Nurse Practitioner | Admitting: Nurse Practitioner

## 2024-01-12 DIAGNOSIS — E2839 Other primary ovarian failure: Secondary | ICD-10-CM

## 2024-01-15 ENCOUNTER — Encounter: Payer: Self-pay | Admitting: Nurse Practitioner

## 2024-01-15 ENCOUNTER — Other Ambulatory Visit: Payer: Self-pay | Admitting: Nurse Practitioner

## 2024-01-15 ENCOUNTER — Ambulatory Visit: Payer: Self-pay | Admitting: Nurse Practitioner

## 2024-01-15 VITALS — BP 126/80 | HR 105 | Temp 98.4°F | Wt 213.0 lb

## 2024-01-15 DIAGNOSIS — E6609 Other obesity due to excess calories: Secondary | ICD-10-CM

## 2024-01-15 DIAGNOSIS — G4730 Sleep apnea, unspecified: Secondary | ICD-10-CM | POA: Insufficient documentation

## 2024-01-15 DIAGNOSIS — I10 Essential (primary) hypertension: Secondary | ICD-10-CM | POA: Diagnosis not present

## 2024-01-15 DIAGNOSIS — Z6836 Body mass index (BMI) 36.0-36.9, adult: Secondary | ICD-10-CM | POA: Insufficient documentation

## 2024-01-15 DIAGNOSIS — E782 Mixed hyperlipidemia: Secondary | ICD-10-CM | POA: Diagnosis not present

## 2024-01-15 DIAGNOSIS — R7303 Prediabetes: Secondary | ICD-10-CM | POA: Diagnosis not present

## 2024-01-15 DIAGNOSIS — E66812 Obesity, class 2: Secondary | ICD-10-CM

## 2024-01-15 MED ORDER — ZEPBOUND 2.5 MG/0.5ML ~~LOC~~ SOAJ: 2.5000 mg | SUBCUTANEOUS | 0 refills | Status: AC

## 2024-01-15 MED ORDER — VALSARTAN 40 MG PO TABS: 40.0000 mg | ORAL_TABLET | Freq: Every day | ORAL | 2 refills | Status: DC

## 2024-01-15 NOTE — Assessment & Plan Note (Addendum)
 She was diagnosed in 2022 and was to be started on a CPAP machine but it looks like she did not f/u with Neurology. I have advised her to contact Dr. Frances Furbish about her CPAP machine. Will also see if she is able to get Zepbound. Discussed side effects, no family history of medullary thyroid cancer or personal history of pancreatitis. If approved will return for nurse visit for teaching

## 2024-01-15 NOTE — Progress Notes (Signed)
 Madelaine Bhat, CMA,acting as a Neurosurgeon for Arnette Felts, FNP.,have documented all relevant documentation on the behalf of Arnette Felts, FNP,as directed by  Arnette Felts, FNP while in the presence of Arnette Felts, FNP.  Subjective:  Patient ID: Shirley Kent , female    DOB: 06-23-1956 , 68 y.o.   MRN: [id number]  Chief Complaint  Patient presents with   Hypertension   Hyperlipidemia    HPI  Patient presents today for a bp and pre dm follow up, Patient reports compliance with medication. Patient denies any chest pain, SOB, or headaches. Patient has no concerns today. She took her medications this morning at 7a. She is walking daily.   Hypertension This is a chronic problem. The current episode started more than 1 year ago. The problem is unchanged. The problem is uncontrolled. Pertinent negatives include no anxiety, blurred vision, chest pain, headaches, palpitations or shortness of breath. There are no associated agents to hypertension. Risk factors for coronary artery disease include obesity and sedentary lifestyle. Past treatments include calcium channel blockers. The current treatment provides mild improvement. There are no compliance problems.  There is no history of angina or kidney disease. There is no history of chronic renal disease.     Past Medical History:  Diagnosis Date   Fibroid    Hyperlipidemia    Hypertension    not on meds, working on diet and exercise   OA (osteoarthritis) of knee    bilateral   Prediabetes    Vitamin D deficiency disease      Family History  Problem Relation Age of Onset   Heart failure Mother    Kidney disease Mother    ADD / ADHD Mother    Heart disease Mother    Multiple sclerosis Sister 33   Breast cancer Neg Hx      Current Outpatient Medications:    amLODipine (NORVASC) 10 MG tablet, TAKE 1 TABLET BY MOUTH EVERY DAY, Disp: 90 tablet, Rfl: 1   fluticasone (FLONASE) 50 MCG/ACT nasal spray, SPRAY 1 SPRAY INTO BOTH NOSTRILS  DAILY., Disp: 16 mL, Rfl: 5   hydrocortisone (ANUSOL-HC) 25 MG suppository, Place 1 suppository (25 mg total) rectally 2 (two) times daily. For up to one week, Disp: 28 suppository, Rfl: 2   metFORMIN (GLUCOPHAGE) 1000 MG tablet, TAKE 1 TABLET (1,000 MG TOTAL) BY MOUTH TWICE A DAY WITH FOOD, Disp: 180 tablet, Rfl: 3   rosuvastatin (CRESTOR) 10 MG tablet, TAKE 1 TABLET BY MOUTH EVERY DAY, Disp: 90 tablet, Rfl: 1   tirzepatide (ZEPBOUND) 2.5 MG/0.5ML Pen, Inject 2.5 mg into the skin once a week., Disp: 2 mL, Rfl: 0   valsartan (DIOVAN) 40 MG tablet, Take 1 tablet (40 mg total) by mouth daily., Disp: 30 tablet, Rfl: 2   No Known Allergies   Review of Systems  Constitutional: Negative.   HENT: Negative.    Eyes: Negative.  Negative for blurred vision.  Respiratory: Negative.  Negative for shortness of breath.   Cardiovascular: Negative.  Negative for chest pain and palpitations.  Gastrointestinal: Negative.   Neurological: Negative.  Negative for headaches.  Psychiatric/Behavioral: Negative.       Today's Vitals   01/15/24 0855 01/15/24 0932  BP: (!) 136/90 126/80  Pulse: (!) 105   Temp: 98.4 F (36.9 C)   TempSrc: Oral   Weight: 213 lb (96.6 kg)   PainSc: 0-No pain    Body mass index is 36.56 kg/m.  Wt Readings from Last 3 Encounters:  01/15/24  213 lb (96.6 kg)  09/15/23 209 lb (94.8 kg)  05/13/23 202 lb (91.6 kg)     Objective:  Physical Exam Vitals and nursing note reviewed.  Constitutional:      General: She is not in acute distress.    Appearance: Normal appearance. She is obese.  HENT:     Mouth/Throat:     Mouth: Mucous membranes are moist.  Cardiovascular:     Rate and Rhythm: Normal rate and regular rhythm.     Pulses: Normal pulses.     Heart sounds: Normal heart sounds. No murmur heard. Pulmonary:     Effort: Pulmonary effort is normal. No respiratory distress.     Breath sounds: Normal breath sounds. No wheezing.  Skin:    General: Skin is warm.      Capillary Refill: Capillary refill takes less than 2 seconds.  Neurological:     General: No focal deficit present.     Mental Status: She is alert and oriented to person, place, and time.     Cranial Nerves: No cranial nerve deficit.     Motor: No weakness.  Psychiatric:        Mood and Affect: Mood normal.        Behavior: Behavior normal.        Thought Content: Thought content normal.        Judgment: Judgment normal.     Assessment And Plan:  Primary hypertension Assessment & Plan: Blood pressure is elevated, repeat improved slightly, will add valsartan to take in the evening. Return to office in 4 weeks for f/u.   Orders: -     BMP8+eGFR  Mixed hyperlipidemia Assessment & Plan: LDL was slightly elevated at last visit will recheck lipid panel.   Orders: -     Lipid panel  Prediabetes Assessment & Plan: HgbA1c is stable. Continue focusing on diet low in sugar and starches.   Orders: -     Hemoglobin A1c  Mild sleep apnea Assessment & Plan: She was diagnosed in 2022 and was to be started on a CPAP machine but it looks like she did not f/u with Neurology. I have advised her to contact Dr. Frances Furbish about her CPAP machine. Will also see if she is able to get Zepbound. Discussed side effects, no family history of medullary thyroid cancer or personal history of pancreatitis. If approved will return for nurse visit for teaching  Orders: -     Zepbound; Inject 2.5 mg into the skin once a week.  Dispense: 2 mL; Refill: 0  Class 2 obesity due to excess calories with body mass index (BMI) of 36.0 to 36.9 in adult, unspecified whether serious comorbidity present Assessment & Plan: Continue exercising regularly at least 150 minutes per week.   Orders: -     Zepbound; Inject 2.5 mg into the skin once a week.  Dispense: 2 mL; Refill: 0  Other orders -     Valsartan; Take 1 tablet (40 mg total) by mouth daily.  Dispense: 30 tablet; Refill: 2   Return for f/u 4 weeks new  medication.  Patient was given opportunity to ask questions. Patient verbalized understanding of the plan and was able to repeat key elements of the plan. All questions were answered to their satisfaction.    Jeanell Sparrow, FNP, have reviewed all documentation for this visit. The documentation on 01/15/24 for the exam, diagnosis, procedures, and orders are all accurate and complete.   IF YOU HAVE BEEN REFERRED TO  A SPECIALIST, IT MAY TAKE 1-2 WEEKS TO SCHEDULE/PROCESS THE REFERRAL. IF YOU HAVE NOT HEARD FROM US/SPECIALIST IN TWO WEEKS, PLEASE GIVE [place] A CALL AT [phone].

## 2024-01-15 NOTE — Patient Instructions (Addendum)
 You can take magnesium glycinate with your evening meal this is over the counter  Hypertension, Adult Hypertension is another name for high blood pressure. High blood pressure forces your heart to work harder to pump blood. This can cause problems over time. There are two numbers in a blood pressure reading. There is a top number (systolic) over a bottom number (diastolic). It is best to have a blood pressure that is below 120/80. What are the causes? The cause of this condition is not known. Some other conditions can lead to high blood pressure. What increases the risk? Some lifestyle factors can make you more likely to develop high blood pressure: Smoking. Not getting enough exercise or physical activity. Being overweight. Having too much fat, sugar, calories, or salt (sodium) in your diet. Drinking too much alcohol. Other risk factors include: Having any of these conditions: Heart disease. Diabetes. High cholesterol. Kidney disease. Obstructive sleep apnea. Having a family history of high blood pressure and high cholesterol. Age. The risk increases with age. Stress. What are the signs or symptoms? High blood pressure may not cause symptoms. Very high blood pressure (hypertensive crisis) may cause: Headache. Fast or uneven heartbeats (palpitations). Shortness of breath. Nosebleed. Vomiting or feeling like you may vomit (nauseous). Changes in how you see. Very bad chest pain. Feeling dizzy. Seizures. How is this treated? This condition is treated by making healthy lifestyle changes, such as: Eating healthy foods. Exercising more. Drinking less alcohol. Your doctor may prescribe medicine if lifestyle changes do not help enough and if: Your top number is above 130. Your bottom number is above 80. Your personal target blood pressure may vary. Follow these instructions at home: Eating and drinking  If told, follow the DASH eating plan. To follow this plan: Fill one half  of your plate at each meal with fruits and vegetables. Fill one fourth of your plate at each meal with whole grains. Whole grains include whole-wheat pasta, brown rice, and whole-grain bread. Eat or drink low-fat dairy products, such as skim milk or low-fat yogurt. Fill one fourth of your plate at each meal with low-fat (lean) proteins. Low-fat proteins include fish, chicken without skin, eggs, beans, and tofu. Avoid fatty meat, cured and processed meat, or chicken with skin. Avoid pre-made or processed food. Limit the amount of salt in your diet to less than 1,500 mg each day. Do not drink alcohol if: Your doctor tells you not to drink. You are pregnant, may be pregnant, or are planning to become pregnant. If you drink alcohol: Limit how much you have to: 0-1 drink a day for women. 0-2 drinks a day for men. Know how much alcohol is in your drink. In the [place] one drink equals one 12 oz bottle of beer (355 mL), one 5 oz glass of wine (148 mL), or one 1 oz glass of hard liquor (44 mL). Lifestyle  Work with your doctor to stay at a healthy weight or to lose weight. Ask your doctor what the best weight is for you. Get at least 30 minutes of exercise that causes your heart to beat faster (aerobic exercise) most days of the week. This may include walking, swimming, or biking. Get at least 30 minutes of exercise that strengthens your muscles (resistance exercise) at least 3 days a week. This may include lifting weights or doing Pilates. Do not smoke or use any products that contain nicotine or tobacco. If you need help quitting, ask your doctor. Check your blood pressure at home  as told by your doctor. Keep all follow-up visits. Medicines Take over-the-counter and prescription medicines only as told by your doctor. Follow directions carefully. Do not skip doses of blood pressure medicine. The medicine does not work as well if you skip doses. Skipping doses also puts you at risk for problems. Ask  your doctor about side effects or reactions to medicines that you should watch for. Contact a doctor if: You think you are having a reaction to the medicine you are taking. You have headaches that keep coming back. You feel dizzy. You have swelling in your ankles. You have trouble with your vision. Get help right away if: You get a very bad headache. You start to feel mixed up (confused). You feel weak or numb. You feel faint. You have very bad pain in your: Chest. Belly (abdomen). You vomit more than once. You have trouble breathing. These symptoms may be an emergency. Get help right away. Call 911. Do not wait to see if the symptoms will go away. Do not drive yourself to the hospital. Summary Hypertension is another name for high blood pressure. High blood pressure forces your heart to work harder to pump blood. For most people, a normal blood pressure is less than 120/80. Making healthy choices can help lower blood pressure. If your blood pressure does not get lower with healthy choices, you may need to take medicine. This information is not intended to replace advice given to you by your health care provider. Make sure you discuss any questions you have with your health care provider. Document Revised: 07/19/2021 Document Reviewed: 07/19/2021 Elsevier Patient Education  2024 [employer].

## 2024-01-15 NOTE — Assessment & Plan Note (Signed)
LDL was slightly elevated at last visit will recheck lipid panel.

## 2024-01-15 NOTE — Assessment & Plan Note (Signed)
 Continue exercising regularly at least 150 minutes per week.

## 2024-01-15 NOTE — Assessment & Plan Note (Signed)
HgbA1c is stable. Continue focusing on diet low in sugar and starches.

## 2024-01-15 NOTE — Assessment & Plan Note (Signed)
 Blood pressure is elevated, repeat improved slightly, will add valsartan to take in the evening. Return to office in 4 weeks for f/u.

## 2024-01-16 LAB — BMP8+EGFR
BUN/Creatinine Ratio: 16 (ref 12–28)
BUN: 13 mg/dL (ref 8–27)
CO2: 23 mmol/L (ref 20–29)
Calcium: 9.9 mg/dL (ref 8.7–10.3)
Chloride: 102 mmol/L (ref 96–106)
Creatinine, Ser: 0.82 mg/dL (ref 0.57–1.00)
Glucose: 96 mg/dL (ref 70–99)
Potassium: 4.4 mmol/L (ref 3.5–5.2)
Sodium: 142 mmol/L (ref 134–144)
eGFR: 78 mL/min/{1.73_m2} (ref >59)

## 2024-01-16 LAB — LIPID PANEL
Chol/HDL Ratio: 3.2 ratio (ref 0.0–4.4)
Cholesterol, Total: 172 mg/dL (ref 100–199)
HDL: 53 mg/dL (ref >39)
LDL Chol Calc (NIH): 101 mg/dL — ABNORMAL HIGH (ref 0–99)
Triglycerides: 98 mg/dL (ref 0–149)
VLDL Cholesterol Cal: 18 mg/dL (ref 5–40)

## 2024-01-16 LAB — HEMOGLOBIN A1C
Est. average glucose Bld gHb Est-mCnc: 126 mg/dL
Hgb A1c MFr Bld: 6 % — ABNORMAL HIGH (ref 4.8–5.6)

## 2024-01-18 ENCOUNTER — Encounter: Payer: Self-pay | Admitting: Nurse Practitioner

## 2024-01-22 ENCOUNTER — Other Ambulatory Visit: Payer: Self-pay | Admitting: Nurse Practitioner

## 2024-01-22 DIAGNOSIS — I10 Essential (primary) hypertension: Secondary | ICD-10-CM

## 2024-01-27 ENCOUNTER — Telehealth: Payer: Self-pay

## 2024-01-27 NOTE — Telephone Encounter (Signed)
 Zepbound PA denied. Not covered by insurance.

## 2024-04-10 ENCOUNTER — Other Ambulatory Visit: Payer: Self-pay | Admitting: Nurse Practitioner

## 2024-04-12 ENCOUNTER — Encounter: Payer: Self-pay | Admitting: Nurse Practitioner

## 2024-05-02 ENCOUNTER — Other Ambulatory Visit: Payer: Self-pay | Admitting: Nurse Practitioner

## 2024-05-02 DIAGNOSIS — E782 Mixed hyperlipidemia: Secondary | ICD-10-CM

## 2024-05-12 ENCOUNTER — Encounter: Payer: Self-pay | Admitting: Nurse Practitioner

## 2024-05-12 ENCOUNTER — Telehealth: Payer: Self-pay | Admitting: Nurse Practitioner

## 2024-05-12 NOTE — Telephone Encounter (Signed)
 Called pt to reschedule 7/31 appt due to provider being out of the office.

## 2024-05-13 ENCOUNTER — Encounter: Payer: Self-pay | Admitting: Nurse Practitioner

## 2024-06-22 NOTE — Progress Notes (Signed)
 [name], CMA,acting as a Neurosurgeon for Shirley Ada, FNP.,have documented all relevant documentation on the behalf of Shirley Ada, FNP,as directed by  Shirley Ada, FNP while in the presence of Shirley Ada, FNP.  Subjective:    Patient ID: Shirley Kent , female    DOB: May 24, 1956 , 68 y.o.   MRN: [id number]  Chief Complaint  Patient presents with   Annual Exam    Patient presents today for HM, Patient reports compliance with medication. Patient denies any chest pain, SOB, or headaches. Patient has no concerns today.    Discussed the use of AI scribe software for clinical note transcription with the patient, who gave verbal consent to proceed.  History of Present Illness Shirley Kent is a 68 year old female who presents for an annual physical exam.  She has not seen any other healthcare providers since her last visit. She maintains a regular exercise routine, aiming for about four days a week, and stays active around the house. Her diet is inconsistent due to frequent travel and buffet meals, but she ensures adequate hydration.  She has undergone a hysterectomy and experiences hot flashes. She has a new gynecologist, whom she has visited once for a urinary infection.  She has not yet received the new COVID-19 vaccine.  No chest pain, shortness of breath, constipation, or diarrhea. She experiences occasional knee pain, particularly with bad weather, and notes swelling in her feet and ankles only when flying. She performs self-breast exams and had a breast reduction a few years ago.        Hypertension This is a chronic problem. The current episode started more than 1 year ago. The problem is unchanged. The problem is controlled. Pertinent negatives include no anxiety, blurred vision, chest pain, headaches, palpitations or shortness of breath. There are no associated agents to hypertension. Risk factors for coronary artery disease include obesity and sedentary lifestyle.  Past treatments include calcium  channel blockers. The current treatment provides mild improvement. There are no compliance problems.  There is no history of angina or kidney disease. There is no history of chronic renal disease.     Past Medical History:  Diagnosis Date   Anxiety    Fibroid    Hyperlipidemia    Hypertension    not on meds, working on diet and exercise   OA (osteoarthritis) of knee    bilateral   Prediabetes    Vitamin D  deficiency disease      Family History  Problem Relation Age of Onset   Heart failure Mother    Kidney disease Mother    ADD / ADHD Mother    Heart disease Mother    Arthritis Mother    Hypertension Mother    Multiple sclerosis Sister 78   Breast cancer Neg Hx      Current Outpatient Medications:    amLODipine  (NORVASC ) 10 MG tablet, TAKE 1 TABLET BY MOUTH EVERY DAY, Disp: 90 tablet, Rfl: 1   fluticasone  (FLONASE ) 50 MCG/ACT nasal spray, SPRAY 1 SPRAY INTO BOTH NOSTRILS DAILY., Disp: 16 mL, Rfl: 5   hydrocortisone  (ANUSOL -HC) 25 MG suppository, Place 1 suppository (25 mg total) rectally 2 (two) times daily. For up to one week, Disp: 28 suppository, Rfl: 2   metFORMIN  (GLUCOPHAGE ) 1000 MG tablet, TAKE 1 TABLET (1,000 MG TOTAL) BY MOUTH TWICE A DAY WITH FOOD, Disp: 180 tablet, Rfl: 3   rosuvastatin  (CRESTOR ) 10 MG tablet, TAKE 1 TABLET BY MOUTH EVERY DAY, Disp: 90 tablet, Rfl:  1   valsartan  (DIOVAN ) 40 MG tablet, TAKE 1 TABLET BY MOUTH EVERY DAY, Disp: 90 tablet, Rfl: 2   tirzepatide  (ZEPBOUND ) 2.5 MG/0.5ML Pen, Inject 2.5 mg into the skin once a week. (Patient not taking: Reported on 06/23/2024), Disp: 2 mL, Rfl: 0   No Known Allergies    The patient states she uses status post hysterectomy for birth control. Patient's last menstrual period was 03/26/2004 (approximate).. Negative for Dysmenorrhea and Negative for Menorrhagia. Negative for: breast discharge, breast lump(s), breast pain and breast self exam. Associated symptoms include abnormal  vaginal bleeding. Pertinent negatives include abnormal bleeding (hematology), anxiety, decreased libido, depression, difficulty falling sleep, dyspareunia, history of infertility, nocturia, sexual dysfunction, sleep disturbances, urinary incontinence, urinary urgency, vaginal discharge and vaginal itching. Diet regular.The patient states her exercise level is    . The patient's tobacco use is:  Social History   Tobacco Use  Smoking Status Never  Smokeless Tobacco Never  Tobacco Comments   never smoked  . She has been exposed to passive smoke. The patient's alcohol use is:  Social History   Substance and Sexual Activity  Alcohol Use Never      Review of Systems  Constitutional: Negative.   HENT: Negative.    Eyes: Negative.  Negative for blurred vision.  Respiratory: Negative.  Negative for shortness of breath.   Cardiovascular: Negative.  Negative for chest pain and palpitations.  Gastrointestinal: Negative.   Endocrine: Negative.   Genitourinary: Negative.   Musculoskeletal: Negative.   Skin: Negative.   Allergic/Immunologic: Negative.   Neurological: Negative.  Negative for headaches.  Hematological: Negative.   Psychiatric/Behavioral: Negative.       Today's Vitals   06/23/24 1151  BP: 130/70  Pulse: 85  Temp: 98.8 F (37.1 C)  TempSrc: Oral  Weight: 214 lb 3.2 oz (97.2 kg)  Height: 5' 4 (1.626 m)  PainSc: 0-No pain   Body mass index is 36.77 kg/m.  Wt Readings from Last 3 Encounters:  06/23/24 214 lb 3.2 oz (97.2 kg)  01/15/24 213 lb (96.6 kg)  09/15/23 209 lb (94.8 kg)     Objective:  Physical Exam Vitals and nursing note reviewed.  Constitutional:      General: She is not in acute distress.    Appearance: Normal appearance. She is well-developed. She is obese.  HENT:     Head: Normocephalic and atraumatic.     Right Ear: Hearing, tympanic membrane, ear canal and external ear normal. There is no impacted cerumen.     Left Ear: Hearing, tympanic  membrane, ear canal and external ear normal. There is no impacted cerumen.     Nose: Nose normal.     Mouth/Throat:     Mouth: Mucous membranes are moist.  Eyes:     General: Lids are normal.     Extraocular Movements: Extraocular movements intact.     Conjunctiva/sclera: Conjunctivae normal.     Pupils: Pupils are equal, round, and reactive to light.     Funduscopic exam:    Right eye: No papilledema.        Left eye: No papilledema.  Neck:     Thyroid: No thyroid mass.     Vascular: No carotid bruit.  Cardiovascular:     Rate and Rhythm: Normal rate and regular rhythm.     Pulses: Normal pulses.     Heart sounds: Normal heart sounds. No murmur heard. Pulmonary:     Effort: Pulmonary effort is normal. No respiratory distress.  Breath sounds: Normal breath sounds. No wheezing.  Chest:  Breasts:    Tanner Score is 5.     Breasts are symmetrical.     Right: Normal. No swelling, mass or tenderness.     Left: Normal. No swelling, mass or tenderness.  Abdominal:     General: Abdomen is flat. Bowel sounds are normal. There is no distension.     Palpations: Abdomen is soft.     Tenderness: There is no abdominal tenderness.  Musculoskeletal:        General: No swelling or tenderness. Normal range of motion.     Cervical back: Full passive range of motion without pain, normal range of motion and neck supple.     Right lower leg: No edema.     Left lower leg: No edema.  Lymphadenopathy:     Upper Body:     Right upper body: No supraclavicular or axillary adenopathy.     Left upper body: No supraclavicular or axillary adenopathy.  Skin:    General: Skin is warm and dry.     Capillary Refill: Capillary refill takes less than 2 seconds.  Neurological:     General: No focal deficit present.     Mental Status: She is alert and oriented to person, place, and time.     Cranial Nerves: No cranial nerve deficit.     Sensory: No sensory deficit.  Psychiatric:        Mood and Affect:  Mood normal.        Behavior: Behavior normal.        Thought Content: Thought content normal.        Judgment: Judgment normal.         Assessment And Plan:     Encounter for annual health examination Assessment & Plan: Diet improvement needed during travel. - Encourage regular exercise and healthy diet, especially during travel. - Advise to increase vegetable intake and reduce starches. - Continue regular dental visits. - Behavior modifications discussed and diet history reviewed.   - Pt will continue to exercise regularly and modify diet with low GI, plant based foods and decrease intake of     processed foods.  - Recommend intake of daily multivitamin, Vitamin D , and calcium .  - Flu shot planned. COVID-19 vaccination discussed. Regular self-breast exams performed. No recent GYN visit. - Sent prescription for [employer] COVID-19 vaccine to pharmacy - Advise to obtain flu shot at the end of September. - Encourage regular self-breast exams.    Mixed hyperlipidemia Assessment & Plan: LDL was slightly elevated at last visit will recheck lipid panel.   Orders: -     CMP14+EGFR -     Lipid panel  Prediabetes Assessment & Plan: HgbA1c is stable. Continue focusing on diet low in sugar and starches.   Orders: -     Hemoglobin A1c  Vitamin D  deficiency Assessment & Plan: Will check vitamin D  level and supplement as needed.    Also encouraged to spend 15 minutes in the sun daily.     Obesity (BMI 35.0-39.9 without comorbidity) Assessment & Plan: She is encouraged to strive for BMI less than 30 to decrease cardiac risk. Advised to aim for at least 150 minutes of exercise per week. Encouraged to focus on healthy diet even when traveling    COVID-19 vaccination declined  Influenza vaccination declined  Other long term (current) drug therapy -     CBC with Differential/Platelet  Hot flashes Assessment & Plan: Ongoing hot flashes post-hysterectomy.  Exercise can  alleviate symptoms. Caffeine, sugary foods, and carbs can exacerbate symptoms, especially at night. - Encourage regular exercise to help manage hot flashes. - Advise to reduce intake of caffeine, sugary foods, and carbs, particularly in the evening.   Primary hypertension Assessment & Plan: Blood pressure is better controlled, continue current medications. EKG done no changes from previous  Orders: -     EKG 12-Lead -     POCT URINALYSIS DIP (CLINITEK) -     CMP14+EGFR -     Microalbumin / creatinine urine ratio  Other orders -     COVID-19 mRNA Vac-TriS(Pfizer); Inject 0.3 mLs into the muscle once for 1 dose.  Dispense: 0.3 mL; Refill: 0     Return for 1 year physical, 6 month bp check. Patient was given opportunity to ask questions. Patient verbalized understanding of the plan and was able to repeat key elements of the plan. All questions were answered to their satisfaction.   Shirley Ada, FNP  I, Shirley Ada, FNP, have reviewed all documentation for this visit. The documentation on 06/23/24 for the exam, diagnosis, procedures, and orders are all accurate and complete.

## 2024-06-23 ENCOUNTER — Encounter: Payer: Self-pay | Admitting: Nurse Practitioner

## 2024-06-23 ENCOUNTER — Ambulatory Visit (INDEPENDENT_AMBULATORY_CARE_PROVIDER_SITE_OTHER): Admitting: Nurse Practitioner

## 2024-06-23 VITALS — BP 130/70 | HR 85 | Temp 98.8°F | Ht 64.0 in | Wt 214.2 lb

## 2024-06-23 DIAGNOSIS — Z0000 Encounter for general adult medical examination without abnormal findings: Secondary | ICD-10-CM | POA: Diagnosis not present

## 2024-06-23 DIAGNOSIS — Z79899 Other long term (current) drug therapy: Secondary | ICD-10-CM

## 2024-06-23 DIAGNOSIS — E559 Vitamin D deficiency, unspecified: Secondary | ICD-10-CM

## 2024-06-23 DIAGNOSIS — R7303 Prediabetes: Secondary | ICD-10-CM

## 2024-06-23 DIAGNOSIS — R232 Flushing: Secondary | ICD-10-CM

## 2024-06-23 DIAGNOSIS — E782 Mixed hyperlipidemia: Secondary | ICD-10-CM

## 2024-06-23 DIAGNOSIS — E669 Obesity, unspecified: Secondary | ICD-10-CM

## 2024-06-23 DIAGNOSIS — Z2821 Immunization not carried out because of patient refusal: Secondary | ICD-10-CM

## 2024-06-23 DIAGNOSIS — I10 Essential (primary) hypertension: Secondary | ICD-10-CM

## 2024-06-23 LAB — POCT URINALYSIS DIP (CLINITEK)
Bilirubin, UA: NEGATIVE
Blood, UA: NEGATIVE
Glucose, UA: NEGATIVE mg/dL
Ketones, POC UA: NEGATIVE mg/dL
Leukocytes, UA: NEGATIVE
Nitrite, UA: NEGATIVE
POC PROTEIN,UA: NEGATIVE
Spec Grav, UA: 1.015 (ref 1.010–1.025)
Urobilinogen, UA: NEGATIVE U/dL — AB
pH, UA: 7.5 (ref 5.0–8.0)

## 2024-06-23 MED ORDER — COVID-19 MRNA VAC-TRIS(PFIZER) 30 MCG/0.3ML IM SUSY: 0.3000 mL | PREFILLED_SYRINGE | Freq: Once | INTRAMUSCULAR | 0 refills | Status: AC

## 2024-06-23 MED ORDER — COVID-19 MRNA VAC-TRIS(PFIZER) 30 MCG/0.3ML IM SUSY: 0.3000 mL | PREFILLED_SYRINGE | Freq: Once | INTRAMUSCULAR | 0 refills | Status: DC

## 2024-06-23 NOTE — Assessment & Plan Note (Signed)
 Diet improvement needed during travel. - Encourage regular exercise and healthy diet, especially during travel. - Advise to increase vegetable intake and reduce starches. - Continue regular dental visits. - Behavior modifications discussed and diet history reviewed.   - Pt will continue to exercise regularly and modify diet with low GI, plant based foods and decrease intake of     processed foods.  - Recommend intake of daily multivitamin, Vitamin D , and calcium .  - Flu shot planned. COVID-19 vaccination discussed. Regular self-breast exams performed. No recent GYN visit. - Sent prescription for [employer] COVID-19 vaccine to pharmacy - Advise to obtain flu shot at the end of September. - Encourage regular self-breast exams.

## 2024-06-23 NOTE — Assessment & Plan Note (Signed)
 Will check vitamin D  level and supplement as needed.    Also encouraged to spend 15 minutes in the sun daily.

## 2024-06-23 NOTE — Assessment & Plan Note (Signed)
 Ongoing hot flashes post-hysterectomy. Exercise can alleviate symptoms. Caffeine, sugary foods, and carbs can exacerbate symptoms, especially at night. - Encourage regular exercise to help manage hot flashes. - Advise to reduce intake of caffeine, sugary foods, and carbs, particularly in the evening.

## 2024-06-23 NOTE — Assessment & Plan Note (Signed)
HgbA1c is stable. Continue focusing on diet low in sugar and starches.

## 2024-06-23 NOTE — Assessment & Plan Note (Addendum)
 Blood pressure is better controlled, continue current medications. EKG done no changes from previous

## 2024-06-23 NOTE — Assessment & Plan Note (Signed)
 She is encouraged to strive for BMI less than 30 to decrease cardiac risk. Advised to aim for at least 150 minutes of exercise per week. Encouraged to focus on healthy diet even when traveling

## 2024-06-23 NOTE — Assessment & Plan Note (Signed)
LDL was slightly elevated at last visit will recheck lipid panel.

## 2024-06-24 LAB — MICROALBUMIN / CREATININE URINE RATIO
Creatinine, Urine: 216 mg/dL
Microalb/Creat Ratio: 4 mg/g{creat} (ref 0–29)
Microalbumin, Urine: 7.8 ug/mL

## 2024-06-24 LAB — CBC WITH DIFFERENTIAL/PLATELET
Basophils Absolute: 0 x10E3/uL (ref 0.0–0.2)
Basos: 1 %
EOS (ABSOLUTE): 0.3 x10E3/uL (ref 0.0–0.4)
Eos: 5 %
Hematocrit: 36.8 % (ref 34.0–46.6)
Hemoglobin: 11.8 g/dL (ref 11.1–15.9)
Immature Grans (Abs): 0 x10E3/uL (ref 0.0–0.1)
Immature Granulocytes: 0 %
Lymphocytes Absolute: 2.7 x10E3/uL (ref 0.7–3.1)
Lymphs: 41 %
MCH: 29.1 pg (ref 26.6–33.0)
MCHC: 32.1 g/dL (ref 31.5–35.7)
MCV: 91 fL (ref 79–97)
Monocytes Absolute: 0.5 x10E3/uL (ref 0.1–0.9)
Monocytes: 8 %
Neutrophils Absolute: 3.1 x10E3/uL (ref 1.4–7.0)
Neutrophils: 45 %
Platelets: 305 x10E3/uL (ref 150–450)
RBC: 4.05 x10E6/uL (ref 3.77–5.28)
RDW: 13 % (ref 11.7–15.4)
WBC: 6.7 x10E3/uL (ref 3.4–10.8)

## 2024-06-24 LAB — LIPID PANEL
Chol/HDL Ratio: 3.5 ratio (ref 0.0–4.4)
Cholesterol, Total: 173 mg/dL (ref 100–199)
HDL: 50 mg/dL (ref >39)
LDL Chol Calc (NIH): 102 mg/dL — ABNORMAL HIGH (ref 0–99)
Triglycerides: 118 mg/dL (ref 0–149)
VLDL Cholesterol Cal: 21 mg/dL (ref 5–40)

## 2024-06-24 LAB — CMP14+EGFR
ALT: 11 IU/L (ref 0–32)
AST: 13 IU/L (ref 0–40)
Albumin: 4.4 g/dL (ref 3.9–4.9)
Alkaline Phosphatase: 77 IU/L (ref 44–121)
BUN/Creatinine Ratio: 13 (ref 12–28)
BUN: 12 mg/dL (ref 8–27)
Bilirubin Total: 0.4 mg/dL (ref 0.0–1.2)
CO2: 24 mmol/L (ref 20–29)
Calcium: 9.8 mg/dL (ref 8.7–10.3)
Chloride: 102 mmol/L (ref 96–106)
Creatinine, Ser: 0.91 mg/dL (ref 0.57–1.00)
Globulin, Total: 2.8 g/dL (ref 1.5–4.5)
Glucose: 89 mg/dL (ref 70–99)
Potassium: 4.5 mmol/L (ref 3.5–5.2)
Sodium: 143 mmol/L (ref 134–144)
Total Protein: 7.2 g/dL (ref 6.0–8.5)
eGFR: 69 mL/min/1.73 (ref >59)

## 2024-06-24 LAB — HEMOGLOBIN A1C
Est. average glucose Bld gHb Est-mCnc: 117 mg/dL
Hgb A1c MFr Bld: 5.7 % — ABNORMAL HIGH (ref 4.8–5.6)

## 2024-07-04 ENCOUNTER — Ambulatory Visit: Payer: Self-pay | Admitting: Nurse Practitioner

## 2024-07-04 NOTE — Patient Instructions (Signed)
 Health Maintenance  Topic Date Due   COVID-19 Vaccine (7 - Pfizer risk 2024-25 season) 07/09/2024*   Breast Cancer Screening  03/11/2025   Colon Cancer Screening  05/06/2027   DTaP/Tdap/Td vaccine (3 - Td or Tdap) 10/19/2029   Pneumococcal Vaccine for age over 48  Completed   Flu Shot  Completed   DEXA scan (bone density measurement)  Completed   Hepatitis C Screening  Completed   Zoster (Shingles) Vaccine  Completed   HPV Vaccine  Aged Out   Meningitis B Vaccine  Aged Out  *Topic was postponed. The date shown is not the original due date.

## 2024-07-29 ENCOUNTER — Other Ambulatory Visit: Payer: Self-pay

## 2024-07-29 DIAGNOSIS — R7303 Prediabetes: Secondary | ICD-10-CM

## 2024-07-29 MED ORDER — METFORMIN HCL 1000 MG PO TABS
1000.0000 mg | ORAL_TABLET | Freq: Two times a day (BID) | ORAL | 3 refills | Status: AC
Start: 2024-07-29 — End: ?

## 2024-08-04 ENCOUNTER — Other Ambulatory Visit: Payer: Self-pay | Admitting: Nurse Practitioner

## 2024-08-04 DIAGNOSIS — I10 Essential (primary) hypertension: Secondary | ICD-10-CM

## 2024-10-24 ENCOUNTER — Other Ambulatory Visit: Payer: Self-pay | Admitting: Nurse Practitioner

## 2024-10-24 DIAGNOSIS — E782 Mixed hyperlipidemia: Secondary | ICD-10-CM

## 2024-11-12 ENCOUNTER — Other Ambulatory Visit: Payer: Self-pay | Admitting: Nurse Practitioner

## 2024-11-12 DIAGNOSIS — J302 Other seasonal allergic rhinitis: Secondary | ICD-10-CM

## 2024-12-21 ENCOUNTER — Ambulatory Visit: Payer: Self-pay | Admitting: Nurse Practitioner

## 2025-06-29 ENCOUNTER — Encounter: Payer: Self-pay | Admitting: Nurse Practitioner
